# Patient Record
Sex: Male | Born: 1950
Health system: Southern US, Community
[De-identification: ages and names within clinical notes are randomized; demographics above are authoritative.]

## PROBLEM LIST (undated history)

## (undated) DIAGNOSIS — R0683 Snoring: Secondary | ICD-10-CM

## (undated) DIAGNOSIS — I10 Essential (primary) hypertension: Secondary | ICD-10-CM

## (undated) DIAGNOSIS — G5600 Carpal tunnel syndrome, unspecified upper limb: Secondary | ICD-10-CM

## (undated) DIAGNOSIS — M109 Gout, unspecified: Secondary | ICD-10-CM

## (undated) DIAGNOSIS — K219 Gastro-esophageal reflux disease without esophagitis: Secondary | ICD-10-CM

## (undated) DIAGNOSIS — M5030 Other cervical disc degeneration, unspecified cervical region: Secondary | ICD-10-CM

## (undated) DIAGNOSIS — F32A Depression, unspecified: Secondary | ICD-10-CM

## (undated) DIAGNOSIS — E785 Hyperlipidemia, unspecified: Secondary | ICD-10-CM

## (undated) DIAGNOSIS — F329 Major depressive disorder, single episode, unspecified: Secondary | ICD-10-CM

## (undated) DIAGNOSIS — Z973 Presence of spectacles and contact lenses: Secondary | ICD-10-CM

## (undated) DIAGNOSIS — M1990 Unspecified osteoarthritis, unspecified site: Secondary | ICD-10-CM

## (undated) HISTORY — DX: Depression, unspecified: F32.A

## (undated) HISTORY — PX: POLYPECTOMY: SHX149

## (undated) HISTORY — DX: Gout, unspecified: M10.9

## (undated) HISTORY — PX: SHOULDER SURGERY: SHX246

## (undated) HISTORY — DX: Major depressive disorder, single episode, unspecified: F32.9

## (undated) HISTORY — PX: COLONOSCOPY: SHX174

---

## 1981-02-20 HISTORY — PX: SHOULDER ARTHROSCOPY: SHX128

## 2005-04-18 ENCOUNTER — Ambulatory Visit: Payer: Self-pay | Admitting: Gastroenterology

## 2005-05-01 ENCOUNTER — Ambulatory Visit: Payer: Self-pay | Admitting: Gastroenterology

## 2006-02-20 HISTORY — PX: CARPAL TUNNEL RELEASE: SHX101

## 2010-05-04 ENCOUNTER — Encounter: Payer: Self-pay | Admitting: Gastroenterology

## 2010-05-10 NOTE — Letter (Signed)
Summary: Colonoscopy Letter  [facility]  [address] [place], [place] [address]   Phone: [phone]  Fax: [phone]      May 04, 2010 MRN: [id number]   Jeff Cunningham [address] [place], [place]  [address]   Dear Jeff Cunningham,   According to your medical record, it is time for you to schedule a Colonoscopy. The American Cancer Society recommends this procedure as a method to detect early colon cancer. Patients with a family history of colon cancer, or a personal history of colon polyps or inflammatory bowel disease are at increased risk.  This letter has been generated based on the recommendations made at the time of your procedure. If you feel that in your particular situation this may no longer apply, please contact our office.  Please call our office at [phone] to schedule this appointment or to update your records at your earliest convenience.  Thank you for cooperating with [place] to provide you with the very best care possible.   Sincerely,  Judie Petit T. Russella Dar, M.D.  [facility] [phone]

## 2011-05-10 ENCOUNTER — Emergency Department (HOSPITAL_COMMUNITY): Payer: No Typology Code available for payment source

## 2011-05-10 ENCOUNTER — Encounter (HOSPITAL_COMMUNITY): Payer: Self-pay | Admitting: Emergency Medicine

## 2011-05-10 ENCOUNTER — Emergency Department (HOSPITAL_COMMUNITY)
Admission: EM | Admit: 2011-05-10 | Discharge: 2011-05-10 | Disposition: A | Discharge status: Home / Self Care | Payer: No Typology Code available for payment source | Attending: Emergency Medicine | Admitting: Emergency Medicine

## 2011-05-10 DIAGNOSIS — E785 Hyperlipidemia, unspecified: Secondary | ICD-10-CM | POA: Insufficient documentation

## 2011-05-10 DIAGNOSIS — S239XXA Sprain of unspecified parts of thorax, initial encounter: Secondary | ICD-10-CM

## 2011-05-10 DIAGNOSIS — E119 Type 2 diabetes mellitus without complications: Secondary | ICD-10-CM | POA: Insufficient documentation

## 2011-05-10 DIAGNOSIS — I10 Essential (primary) hypertension: Secondary | ICD-10-CM | POA: Insufficient documentation

## 2011-05-10 DIAGNOSIS — M546 Pain in thoracic spine: Secondary | ICD-10-CM | POA: Insufficient documentation

## 2011-05-10 DIAGNOSIS — M542 Cervicalgia: Secondary | ICD-10-CM | POA: Insufficient documentation

## 2011-05-10 DIAGNOSIS — Y92410 Unspecified street and highway as the place of occurrence of the external cause: Secondary | ICD-10-CM | POA: Insufficient documentation

## 2011-05-10 HISTORY — DX: Essential (primary) hypertension: I10

## 2011-05-10 HISTORY — DX: Hyperlipidemia, unspecified: E78.5

## 2011-05-10 MED ORDER — IBUPROFEN 800 MG PO TABS: 800.0000 mg | ORAL_TABLET | Freq: Three times a day (TID) | ORAL | Status: AC

## 2011-05-10 MED ORDER — HYDROCODONE-ACETAMINOPHEN 5-325 MG PO TABS: 1.0000 | ORAL_TABLET | Freq: Four times a day (QID) | ORAL | Status: AC | PRN

## 2011-05-10 NOTE — Discharge Instructions (Signed)
Your x-rays here today were normal.  Return here for any worsening in her condition.  You will be more sore tomorrow and over the next 7-10 days.  Use ice and heat on her neck and back.
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
## 2011-05-10 NOTE — ED Notes (Signed)
Pt returned from xray

## 2011-05-10 NOTE — ED Notes (Signed)
Patient transported to X-ray 
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
## 2011-05-21 NOTE — ED Provider Notes (Signed)
Medical screening examination/treatment/procedure(s) were performed by non-physician practitioner and as supervising physician I was immediately available for consultation/collaboration.  Sacha Radloff, MD 05/21/11 2242 

## 2011-11-08 ENCOUNTER — Encounter: Payer: Self-pay | Admitting: Gastroenterology

## 2012-01-08 ENCOUNTER — Other Ambulatory Visit: Payer: Self-pay | Admitting: Orthopaedic Surgery

## 2012-01-12 ENCOUNTER — Encounter (HOSPITAL_BASED_OUTPATIENT_CLINIC_OR_DEPARTMENT_OTHER): Payer: Self-pay | Admitting: *Deleted

## 2012-01-12 ENCOUNTER — Other Ambulatory Visit: Payer: Self-pay

## 2012-01-12 ENCOUNTER — Encounter (HOSPITAL_BASED_OUTPATIENT_CLINIC_OR_DEPARTMENT_OTHER)
Admission: RE | Admit: 2012-01-12 | Discharge: 2012-01-12 | Disposition: A | Discharge status: Home / Self Care | Payer: Medicare Other | Source: Ambulatory Visit | Attending: Orthopaedic Surgery | Admitting: Orthopaedic Surgery

## 2012-01-12 DIAGNOSIS — Z6835 Body mass index (BMI) 35.0-35.9, adult: Secondary | ICD-10-CM | POA: Diagnosis not present

## 2012-01-12 DIAGNOSIS — E785 Hyperlipidemia, unspecified: Secondary | ICD-10-CM | POA: Diagnosis not present

## 2012-01-12 DIAGNOSIS — I10 Essential (primary) hypertension: Secondary | ICD-10-CM | POA: Diagnosis not present

## 2012-01-12 DIAGNOSIS — K219 Gastro-esophageal reflux disease without esophagitis: Secondary | ICD-10-CM | POA: Diagnosis not present

## 2012-01-12 DIAGNOSIS — M129 Arthropathy, unspecified: Secondary | ICD-10-CM | POA: Diagnosis not present

## 2012-01-12 DIAGNOSIS — E119 Type 2 diabetes mellitus without complications: Secondary | ICD-10-CM | POA: Diagnosis not present

## 2012-01-12 DIAGNOSIS — G5600 Carpal tunnel syndrome, unspecified upper limb: Secondary | ICD-10-CM | POA: Diagnosis not present

## 2012-01-12 LAB — BASIC METABOLIC PANEL
BUN: 11 mg/dL (ref 6–23)
Chloride: 103 mEq/L (ref 96–112)
Glucose, Bld: 121 mg/dL — ABNORMAL HIGH (ref 70–99)
Potassium: 4.5 mEq/L (ref 3.5–5.1)

## 2012-01-12 NOTE — H&P (Signed)
Jeff Cunningham is an 61 y.o. male.   Chief Complaint: left hand numbness and tingling. HPI: this patient has had continued left hand numbness and tingling for quite some time.  He feels that he is dropping things and is having trouble manipulating his fingers for fine motors function.  A recent nerve conduction study done by Dr. Regino Schultze was positive for a couple tunnel syndrome left side.  Jeff Cunningham is Arty had the right side released in 2007.  We have discussed proceeding with a left carpal tunnel release to improve function and decrease numbness.  Past Medical History  Diagnosis Date  . Hypertension   . Diabetes mellitus   . Hyperlipidemia   . GERD (gastroesophageal reflux disease)   . Arthritis   . DDD (degenerative disc disease), cervical   . Wears glasses   . Snores   . Carpal tunnel syndrome     Past Surgical History  Procedure Date  . Shoulder surgery   . Carpal tunnel release 2008    rt  . Shoulder arthroscopy 1983    left  . Colonoscopy     No family history on file. Social History:  reports that he has never smoked. He does not have any smokeless tobacco history on file. He reports that he drinks alcohol. His drug history not on file.  Allergies: No Known Allergies  No prescriptions prior to admission    No results found for this or any previous visit (from the past 48 hour(s)). No results found.  Review of Systems  Constitutional: Negative.   HENT: Negative.   Eyes: Negative.   Respiratory: Negative.   Cardiovascular: Negative.   Gastrointestinal: Negative.   Genitourinary: Negative.   Musculoskeletal: Negative.   Skin: Negative.   Neurological: Positive for tingling.  Endo/Heme/Allergies: Negative.   Psychiatric/Behavioral: Negative.     Height 5\' 8"  (1.727 m), weight 106.595 kg (235 lb). Physical Exam  Constitutional: He appears well-nourished.  HENT:  Head: Atraumatic.  Eyes: EOM are normal.  Neck: Neck supple.  Cardiovascular: Regular rhythm.     Respiratory: Breath sounds normal.  GI: Bowel sounds are normal.  Musculoskeletal:       Left hand exam: No atrophy noted.  Positive Tinel's at the wrist.  He does put past 2 point discrimination in both distributions a 10 mm.  Good grip strength.  Good pulses.  Neurological: He is alert.  Skin: Skin is warm.  Psychiatric: He has a normal mood and affect.     Assessment/Plan Assessment: Left carpal tunnel syndrome by recent nerve conduction study. Plan.  He does have significant carpal tunnel issues on the left side by the recent nerve conduction study.  We have discussed with him proceeding with a left carpal tunnel release and have reviewed the risk of anesthesia, infection and neurovascular injury related to carpal tunnel release on the left.  Have also told him it most likely will need postoperative physical therapy.  Jeff Cunningham,Jeff Cunningham 01/12/2012, 12:47 PM

## 2012-01-12 NOTE — Progress Notes (Signed)
Here for ekg-bmet-no cardiac problems

## 2012-01-16 ENCOUNTER — Encounter (HOSPITAL_BASED_OUTPATIENT_CLINIC_OR_DEPARTMENT_OTHER): Payer: Self-pay | Admitting: Anesthesiology

## 2012-01-16 ENCOUNTER — Ambulatory Visit (HOSPITAL_BASED_OUTPATIENT_CLINIC_OR_DEPARTMENT_OTHER): Payer: Medicare Other | Admitting: Anesthesiology

## 2012-01-16 ENCOUNTER — Encounter (HOSPITAL_BASED_OUTPATIENT_CLINIC_OR_DEPARTMENT_OTHER): Payer: Self-pay | Admitting: *Deleted

## 2012-01-16 ENCOUNTER — Ambulatory Visit (HOSPITAL_BASED_OUTPATIENT_CLINIC_OR_DEPARTMENT_OTHER)
Admission: RE | Admit: 2012-01-16 | Discharge: 2012-01-16 | Disposition: A | Discharge status: Home / Self Care | Payer: Medicare Other | Source: Ambulatory Visit | Attending: Orthopaedic Surgery | Admitting: Orthopaedic Surgery

## 2012-01-16 ENCOUNTER — Encounter (HOSPITAL_BASED_OUTPATIENT_CLINIC_OR_DEPARTMENT_OTHER)
Admission: RE | Disposition: A | Discharge status: Home / Self Care | Payer: Self-pay | Source: Ambulatory Visit | Attending: Orthopaedic Surgery

## 2012-01-16 DIAGNOSIS — G5600 Carpal tunnel syndrome, unspecified upper limb: Secondary | ICD-10-CM | POA: Insufficient documentation

## 2012-01-16 DIAGNOSIS — G5602 Carpal tunnel syndrome, left upper limb: Secondary | ICD-10-CM

## 2012-01-16 DIAGNOSIS — E119 Type 2 diabetes mellitus without complications: Secondary | ICD-10-CM | POA: Insufficient documentation

## 2012-01-16 DIAGNOSIS — I10 Essential (primary) hypertension: Secondary | ICD-10-CM | POA: Insufficient documentation

## 2012-01-16 DIAGNOSIS — Z6835 Body mass index (BMI) 35.0-35.9, adult: Secondary | ICD-10-CM | POA: Insufficient documentation

## 2012-01-16 DIAGNOSIS — K219 Gastro-esophageal reflux disease without esophagitis: Secondary | ICD-10-CM | POA: Insufficient documentation

## 2012-01-16 DIAGNOSIS — M129 Arthropathy, unspecified: Secondary | ICD-10-CM | POA: Insufficient documentation

## 2012-01-16 DIAGNOSIS — E785 Hyperlipidemia, unspecified: Secondary | ICD-10-CM | POA: Insufficient documentation

## 2012-01-16 HISTORY — DX: Other cervical disc degeneration, unspecified cervical region: M50.30

## 2012-01-16 HISTORY — DX: Carpal tunnel syndrome, unspecified upper limb: G56.00

## 2012-01-16 HISTORY — DX: Unspecified osteoarthritis, unspecified site: M19.90

## 2012-01-16 HISTORY — DX: Snoring: R06.83

## 2012-01-16 HISTORY — PX: CARPAL TUNNEL RELEASE: SHX101

## 2012-01-16 HISTORY — DX: Presence of spectacles and contact lenses: Z97.3

## 2012-01-16 HISTORY — DX: Gastro-esophageal reflux disease without esophagitis: K21.9

## 2012-01-16 LAB — POCT HEMOGLOBIN-HEMACUE: Hemoglobin: 14.7 g/dL (ref 13.0–17.0)

## 2012-01-16 LAB — GLUCOSE, CAPILLARY: Glucose-Capillary: 130 mg/dL — ABNORMAL HIGH (ref 70–99)

## 2012-01-16 SURGERY — CARPAL TUNNEL RELEASE
Anesthesia: General | Site: Wrist | Laterality: Left | Wound class: Clean

## 2012-01-16 MED ORDER — LACTATED RINGERS IV SOLN
INTRAVENOUS | Status: DC
  Administered 2012-01-16 (×2): via INTRAVENOUS

## 2012-01-16 MED ORDER — CHLORHEXIDINE GLUCONATE 4 % EX LIQD: 60.0000 mL | Freq: Once | CUTANEOUS | Status: DC

## 2012-01-16 MED ORDER — LACTATED RINGERS IV SOLN: INTRAVENOUS | Status: DC

## 2012-01-16 MED ORDER — MIDAZOLAM HCL 5 MG/5ML IJ SOLN
INTRAMUSCULAR | Status: DC | PRN
  Administered 2012-01-16: 2 mg via INTRAVENOUS

## 2012-01-16 MED ORDER — OXYCODONE HCL 5 MG/5ML PO SOLN: 5.0000 mg | Freq: Once | ORAL | Status: DC | PRN

## 2012-01-16 MED ORDER — CEFAZOLIN SODIUM-DEXTROSE 2-3 GM-% IV SOLR
2.0000 g | INTRAVENOUS | Status: AC
  Administered 2012-01-16: 2 g via INTRAVENOUS

## 2012-01-16 MED ORDER — ONDANSETRON HCL 4 MG/2ML IJ SOLN: 4.0000 mg | Freq: Once | INTRAMUSCULAR | Status: DC | PRN

## 2012-01-16 MED ORDER — FENTANYL CITRATE 0.05 MG/ML IJ SOLN
INTRAMUSCULAR | Status: DC | PRN
  Administered 2012-01-16: 25 ug via INTRAVENOUS
  Administered 2012-01-16: 100 ug via INTRAVENOUS

## 2012-01-16 MED ORDER — OXYCODONE-ACETAMINOPHEN 10-325 MG PO TABS: 1.0000 | ORAL_TABLET | ORAL | Status: DC | PRN

## 2012-01-16 MED ORDER — OXYCODONE HCL 5 MG PO TABS: 5.0000 mg | ORAL_TABLET | Freq: Once | ORAL | Status: DC | PRN

## 2012-01-16 MED ORDER — ONDANSETRON HCL 4 MG/2ML IJ SOLN
INTRAMUSCULAR | Status: DC | PRN
  Administered 2012-01-16: 4 mg via INTRAVENOUS

## 2012-01-16 MED ORDER — HYDROMORPHONE HCL PF 1 MG/ML IJ SOLN: 0.2500 mg | INTRAMUSCULAR | Status: DC | PRN

## 2012-01-16 MED ORDER — LIDOCAINE HCL (CARDIAC) 20 MG/ML IV SOLN
INTRAVENOUS | Status: DC | PRN
  Administered 2012-01-16: 75 mg via INTRAVENOUS

## 2012-01-16 MED ORDER — PROPOFOL 10 MG/ML IV BOLUS
INTRAVENOUS | Status: DC | PRN
  Administered 2012-01-16: 300 mg via INTRAVENOUS

## 2012-01-16 MED ORDER — BUPIVACAINE HCL (PF) 0.25 % IJ SOLN
INTRAMUSCULAR | Status: DC | PRN
  Administered 2012-01-16: 8 mL

## 2012-01-16 SURGICAL SUPPLY — 51 items
BANDAGE ELASTIC 4 VELCRO ST LF (GAUZE/BANDAGES/DRESSINGS) ×2 IMPLANT
BANDAGE GAUZE ELAST BULKY 4 IN (GAUZE/BANDAGES/DRESSINGS) ×2 IMPLANT
BLADE MINI RND TIP GREEN BEAV (BLADE) ×2 IMPLANT
BLADE SURG 15 STRL LF DISP TIS (BLADE) ×1 IMPLANT
BLADE SURG 15 STRL SS (BLADE) ×2
BNDG CMPR 9X4 STRL LF SNTH (GAUZE/BANDAGES/DRESSINGS) ×1
BNDG ESMARK 4X9 LF (GAUZE/BANDAGES/DRESSINGS) ×1 IMPLANT
COVER MAYO STAND STRL (DRAPES) ×2 IMPLANT
COVER TABLE BACK 60X90 (DRAPES) ×2 IMPLANT
CUFF TOURNIQUET SINGLE 18IN (TOURNIQUET CUFF) ×1 IMPLANT
DRAPE EXTREMITY T 121X128X90 (DRAPE) ×2 IMPLANT
DRAPE SURG 17X23 STRL (DRAPES) ×2 IMPLANT
DRSG EMULSION OIL 3X3 NADH (GAUZE/BANDAGES/DRESSINGS) ×2 IMPLANT
DRSG PAD ABDOMINAL 8X10 ST (GAUZE/BANDAGES/DRESSINGS) IMPLANT
DURAPREP 26ML APPLICATOR (WOUND CARE) ×2 IMPLANT
ELECT NDL TIP 2.8 STRL (NEEDLE) IMPLANT
ELECT NEEDLE TIP 2.8 STRL (NEEDLE) IMPLANT
ELECT REM PT RETURN 9FT ADLT (ELECTROSURGICAL)
ELECTRODE REM PT RTRN 9FT ADLT (ELECTROSURGICAL) ×1 IMPLANT
GLOVE BIO SURGEON STRL SZ8.5 (GLOVE) ×2 IMPLANT
GLOVE BIOGEL PI IND STRL 8 (GLOVE) ×1 IMPLANT
GLOVE BIOGEL PI IND STRL 8.5 (GLOVE) ×1 IMPLANT
GLOVE BIOGEL PI INDICATOR 8 (GLOVE) ×1
GLOVE BIOGEL PI INDICATOR 8.5 (GLOVE) ×1
GLOVE ECLIPSE 6.5 STRL STRAW (GLOVE) ×1 IMPLANT
GLOVE SS BIOGEL STRL SZ 8 (GLOVE) ×1 IMPLANT
GLOVE SUPERSENSE BIOGEL SZ 8 (GLOVE) ×1
GOWN PREVENTION PLUS XLARGE (GOWN DISPOSABLE) ×2 IMPLANT
LOOP VESSEL MAXI BLUE (MISCELLANEOUS) IMPLANT
NDL HYPO 25X1 1.5 SAFETY (NEEDLE) IMPLANT
NEEDLE HYPO 25X1 1.5 SAFETY (NEEDLE) ×2 IMPLANT
NS IRRIG 1000ML POUR BTL (IV SOLUTION) ×2 IMPLANT
PACK BASIN DAY SURGERY FS (CUSTOM PROCEDURE TRAY) ×2 IMPLANT
PADDING CAST ABS 3INX4YD NS (CAST SUPPLIES) ×1
PADDING CAST ABS 4INX4YD NS (CAST SUPPLIES) ×1
PADDING CAST ABS COTTON 3X4 (CAST SUPPLIES) ×1 IMPLANT
PADDING CAST ABS COTTON 4X4 ST (CAST SUPPLIES) IMPLANT
PENCIL BUTTON HOLSTER BLD 10FT (ELECTRODE) IMPLANT
SPLINT PLASTER CAST XFAST 3X15 (CAST SUPPLIES) ×5 IMPLANT
SPLINT PLASTER XTRA FASTSET 3X (CAST SUPPLIES) ×5
SPONGE GAUZE 4X4 12PLY (GAUZE/BANDAGES/DRESSINGS) ×2 IMPLANT
STOCKINETTE 4X48 STRL (DRAPES) ×2 IMPLANT
SUT ETHILON 4 0 PS 2 18 (SUTURE) ×2 IMPLANT
SUT VIC AB 4-0 P-3 18XBRD (SUTURE) IMPLANT
SUT VIC AB 4-0 P3 18 (SUTURE)
SYR BULB 3OZ (MISCELLANEOUS) ×1 IMPLANT
SYR CONTROL 10ML LL (SYRINGE) ×1 IMPLANT
TOWEL OR 17X24 6PK STRL BLUE (TOWEL DISPOSABLE) ×2 IMPLANT
TOWEL OR NON WOVEN STRL DISP B (DISPOSABLE) ×2 IMPLANT
UNDERPAD 30X30 INCONTINENT (UNDERPADS AND DIAPERS) ×2 IMPLANT
WATER STERILE IRR 1000ML POUR (IV SOLUTION) ×1 IMPLANT

## 2012-01-16 NOTE — Transfer of Care (Signed)
Immediate Anesthesia Transfer of Care Note  Patient: Jeff Cunningham  Procedure(s) Performed: Procedure(s) (LRB): CARPAL TUNNEL RELEASE (Left)  Patient Location: Patient transported to PACU with oxygen via face mask at 4 Liters / Min  Anesthesia Type: General  Level of Consciousness: awake and alert   Airway & Oxygen Therapy: Patient Spontanous Breathing and Patient connected to face mask oxygen  Post-op Assessment: Report given to PACU RN and Post -op Vital signs reviewed and stable  Post vital signs: Reviewed and stable  Dentition: Teeth and oropharynx remain in pre-op condition  Complications: No apparent anesthesia complications

## 2012-01-16 NOTE — Anesthesia Postprocedure Evaluation (Signed)
  Anesthesia Post-op Note  Patient: Jeff Cunningham  Procedure(s) Performed: Procedure(s) (LRB) with comments: CARPAL TUNNEL RELEASE (Left)  Patient Location: PACU  Anesthesia Type:General  Level of Consciousness: awake, alert  and oriented  Airway and Oxygen Therapy: Patient Spontanous Breathing  Post-op Pain: mild  Post-op Assessment: Post-op Vital signs reviewed  Post-op Vital Signs: Reviewed  Complications: No apparent anesthesia complications

## 2012-01-16 NOTE — Interval H&P Note (Signed)
History and Physical Interval Note:  01/16/2012 9:28 AM  Jeff Cunningham  has presented today for surgery, with the diagnosis of Left Carpal Tunnel Syndrome  The various methods of treatment have been discussed with the patient and family. After consideration of risks, benefits and other options for treatment, the patient has consented to  Procedure(s) (LRB) with comments: CARPAL TUNNEL RELEASE (Left) as a surgical intervention .  The patient's history has been reviewed, patient examined, no change in status, stable for surgery.  I have reviewed the patient's chart and labs.  Questions were answered to the patient's satisfaction.     [name] G

## 2012-01-16 NOTE — Anesthesia Procedure Notes (Signed)
Procedure Name: LMA Insertion Date/Time: 01/16/2012 9:54 AM Performed by: Fran Lowes Pre-anesthesia Checklist: Patient identified, Emergency Drugs available, Suction available and Patient being monitored Patient Re-evaluated:Patient Re-evaluated prior to inductionOxygen Delivery Method: Circle System Utilized Preoxygenation: Pre-oxygenation with 100% oxygen Intubation Type: IV induction Ventilation: Mask ventilation without difficulty LMA: LMA inserted LMA Size: 4.0 Number of attempts: 1 Airway Equipment and Method: bite block Placement Confirmation: positive ETCO2 Tube secured with: Tape Dental Injury: Teeth and Oropharynx as per pre-operative assessment

## 2012-01-16 NOTE — Op Note (Signed)
PRE-OP DIAGNOSIS:  left carpal tunnel syndrome POST-OP DIAGNOSIS:  left carpal tunnel syndrome PROCEDURE:  left carpal tunnel release ANESTHESIA:  general ATTENDING SURGEON:  Marcene Corning MD ASSISTANT:  Lindwood Qua PA  INDICATION FOR PROCEDURE:  Jeff Cunningham is a 61 y.o. male with a long history of hand pain and numbness.  The patient has failed conservative measures of treatment and persists with symptoms which are very limiting.  The patient is offered carpal tunnel release in hopes of improving their situation by relieving pressure on the medial nerve.  Informed operative consent was obtained after discussion of possible complications including reaction to anesthesia, infection, and neurovascular injury.  SUMMARY OF FINDINGS AND PROCEDURE:  Under to above anesthetic a carpal tunnel release was performed.  The patient had a moderately thickened transverse carpal tunnel ligament which was applying compression to the underlying medial nerve.  This was release under direct visualization.  The skin was closed primarily followed by placement of a sterile dressing and splint with the wrist in slight extension.  DESCRIPTION OF PROCEDURE:  Jeff Cunningham was taken to an operative suite where the above anesthetic was applied.  The patient was then prepped and draped in normal sterile fashion.  An appropriate time out was performed.  After the administration of kefzol pre-operative antibiotic and application and inflation of a tourniquet a small incision was made on the palmar aspect of the palm.  This did not cross the wrist flexion crease and was ulnar to the thenar flexion crease.  Dissection was carried down through palmar fascia to expose the transverse carpal ligament which was released under direct visualization.  The dissection was taken distally towards the transverse arch of vessels and distally through the distal fascia of the forearm.  The wound was irrigated and the skin was closed with  nylon.  The tourniquet was deflated and skin edges became pink immediately and pulses were intact.  I injected some marcaine along the incision. Adaptic was applied along with a sterile dressing and a plaster splint with the wrist in slight extension.  Estimated blood loss and intraoperative fluids can be obtained from anesthesia records as can tourniquet time.  DISPOSITION:  Jeff Cunningham was taken to recovery room in stable condition.  Plans were to go home same day and I will contact the patient by phone tonight.  The patient will follow-up in about a week in the office.  Jeff Cunningham 01/16/2012, 10:22 AM

## 2012-01-16 NOTE — Anesthesia Preprocedure Evaluation (Signed)
Anesthesia Evaluation  Patient identified by MRN, date of birth, ID band Patient awake    Reviewed: Allergy & Precautions, H&P , NPO status , Patient's Chart, lab work & pertinent test results  Airway Mallampati: I TM Distance: >3 FB Neck ROM: Full    Dental  (+) Teeth Intact and Dental Advisory Given   Pulmonary  breath sounds clear to auscultation        Cardiovascular hypertension, Pt. on medications Rhythm:Regular Rate:Normal     Neuro/Psych    GI/Hepatic GERD-  Medicated and Controlled,  Endo/Other  diabetes, Well Controlled, Type 2, Oral Hypoglycemic AgentsMorbid obesity  Renal/GU      Musculoskeletal   Abdominal   Peds  Hematology   Anesthesia Other Findings   Reproductive/Obstetrics                           Anesthesia Physical Anesthesia Plan  ASA: III  Anesthesia Plan: General   Post-op Pain Management:    Induction: Intravenous  Airway Management Planned: LMA  Additional Equipment:   Intra-op Plan:   Post-operative Plan: Extubation in OR  Informed Consent: I have reviewed the patients History and Physical, chart, labs and discussed the procedure including the risks, benefits and alternatives for the proposed anesthesia with the patient or authorized representative who has indicated his/her understanding and acceptance.   Dental advisory given  Plan Discussed with: CRNA, Anesthesiologist and Surgeon  Anesthesia Plan Comments:         Anesthesia Quick Evaluation

## 2012-01-17 LAB — GLUCOSE, CAPILLARY: Glucose-Capillary: 129 mg/dL — ABNORMAL HIGH (ref 70–99)

## 2012-01-22 ENCOUNTER — Encounter (HOSPITAL_BASED_OUTPATIENT_CLINIC_OR_DEPARTMENT_OTHER): Payer: Self-pay | Admitting: Orthopaedic Surgery

## 2012-03-10 ENCOUNTER — Other Ambulatory Visit: Payer: Self-pay | Admitting: Family Medicine

## 2012-03-15 ENCOUNTER — Other Ambulatory Visit: Payer: Self-pay | Admitting: Family Medicine

## 2012-03-25 ENCOUNTER — Other Ambulatory Visit: Payer: Self-pay | Admitting: Family Medicine

## 2012-04-01 DIAGNOSIS — Z125 Encounter for screening for malignant neoplasm of prostate: Secondary | ICD-10-CM | POA: Diagnosis not present

## 2012-08-20 ENCOUNTER — Encounter: Payer: Self-pay | Admitting: Gastroenterology

## 2012-10-15 ENCOUNTER — Ambulatory Visit (AMBULATORY_SURGERY_CENTER): Payer: Medicare Other

## 2012-10-15 VITALS — Ht 67.0 in | Wt 231.4 lb

## 2012-10-15 DIAGNOSIS — Z860100 Personal history of colon polyps, unspecified: Secondary | ICD-10-CM

## 2012-10-15 MED ORDER — SOD PICOSULFATE-MAG OX-CIT ACD 10-3.5-12 MG-GM-GM PO PACK: 1.0000 | PACK | Freq: Once | ORAL | Status: DC

## 2012-10-15 MED ORDER — MOVIPREP 100 G PO SOLR: 1.0000 | Freq: Once | ORAL | Status: DC

## 2012-10-16 ENCOUNTER — Telehealth: Payer: Self-pay | Admitting: Gastroenterology

## 2012-10-16 NOTE — Telephone Encounter (Signed)
Pt will come by office ([facility] 4th floor) Thursday 10/17/12 to pick up sample of Prepopik.

## 2012-10-29 ENCOUNTER — Encounter: Payer: Self-pay | Admitting: Gastroenterology

## 2012-10-29 ENCOUNTER — Ambulatory Visit (AMBULATORY_SURGERY_CENTER): Payer: Medicare Other | Admitting: Gastroenterology

## 2012-10-29 VITALS — BP 114/72 | HR 52 | Temp 97.2°F | Resp 19 | Ht 67.0 in | Wt 231.0 lb

## 2012-10-29 DIAGNOSIS — D126 Benign neoplasm of colon, unspecified: Secondary | ICD-10-CM

## 2012-10-29 DIAGNOSIS — Z860100 Personal history of colon polyps, unspecified: Secondary | ICD-10-CM

## 2012-10-29 LAB — GLUCOSE, CAPILLARY: Glucose-Capillary: 99 mg/dL (ref 70–99)

## 2012-10-29 MED ORDER — SODIUM CHLORIDE 0.9 % IV SOLN: 500.0000 mL | INTRAVENOUS | Status: DC

## 2012-10-29 NOTE — Progress Notes (Signed)
Patient did not experience any of the following events: a burn prior to discharge; a fall within the facility; wrong site/side/patient/procedure/implant event; or a hospital transfer or hospital admission upon discharge from the facility. (G8907) Patient did not have preoperative order for IV antibiotic SSI prophylaxis. (G8918)  

## 2012-10-29 NOTE — Progress Notes (Signed)
Report to pacu rn, vss, bbs=clear 

## 2012-10-29 NOTE — Patient Instructions (Addendum)
YOU HAD AN ENDOSCOPIC PROCEDURE TODAY AT THE [facility]: Refer to the procedure report that was given to you for any specific questions about what was found during the examination.  If the procedure report does not answer your questions, please call your gastroenterologist to clarify.  If you requested that your care partner not be given the details of your procedure findings, then the procedure report has been included in a sealed envelope for you to review at your convenience later.  YOU SHOULD EXPECT: Some feelings of bloating in the abdomen. Passage of more gas than usual.  Walking can help get rid of the air that was put into your GI tract during the procedure and reduce the bloating. If you had a lower endoscopy (such as a colonoscopy or flexible sigmoidoscopy) you may notice spotting of blood in your stool or on the toilet paper. If you underwent a bowel prep for your procedure, then you may not have a normal bowel movement for a few days.  DIET: Your first meal following the procedure should be a light meal and then it is ok to progress to your normal diet.  A half-sandwich or bowl of soup is an example of a good first meal.  Heavy or fried foods are harder to digest and may make you feel nauseous or bloated.  Likewise meals heavy in dairy and vegetables can cause extra gas to form and this can also increase the bloating.  Drink plenty of fluids but you should avoid alcoholic beverages for 24 hours.  ACTIVITY: Your care partner should take you home directly after the procedure.  You should plan to take it easy, moving slowly for the rest of the day.  You can resume normal activity the day after the procedure however you should NOT DRIVE or use heavy machinery for 24 hours (because of the sedation medicines used during the test).    SYMPTOMS TO REPORT IMMEDIATELY: A gastroenterologist can be reached at any hour.  During normal business hours, 8:30 AM to 5:00 PM Monday through Friday,  call [phone].  After hours and on weekends, please call the GI answering service at [phone] who will take a message and have the physician on call contact you.   Following lower endoscopy (colonoscopy or flexible sigmoidoscopy):  Excessive amounts of blood in the stool  Significant tenderness or worsening of abdominal pains  Swelling of the abdomen that is new, acute  Fever of 100F or higher   FOLLOW UP: If any biopsies were taken you will be contacted by phone or by letter within the next 1-3 weeks.  Call your gastroenterologist if you have not heard about the biopsies in 3 weeks.  Our staff will call the home number listed on your records the next business day following your procedure to check on you and address any questions or concerns that you may have at that time regarding the information given to you following your procedure. This is a courtesy call and so if there is no answer at the home number and we have not heard from you through the emergency physician on call, we will assume that you have returned to your regular daily activities without incident.  SIGNATURES/CONFIDENTIALITY: You and/or your care partner have signed paperwork which will be entered into your electronic medical record.  These signatures attest to the fact that that the information above on your After Visit Summary has been reviewed and is understood.  Full responsibility of the confidentiality of   this discharge information lies with you and/or your care-partner.   Resume medications. Information given on polyps,diverticulosis and high fiber diet with discharge instructions. 

## 2012-10-29 NOTE — Progress Notes (Signed)
Called to room to assist during endoscopic procedure.  Patient ID and intended procedure confirmed with present staff. Received instructions for my participation in the procedure from the performing physician. ewm 

## 2012-10-29 NOTE — Op Note (Addendum)
[facility] [address] [place], [address]   COLONOSCOPY PROCEDURE REPORT PATIENT: Jeff Cunningham, Jeff Cunningham  MR#: [id number] BIRTHDATE: 01/26/1951 , 62  yrs. old GENDER: Male ENDOSCOPIST: Meryl Dare, MD, [facility] PROCEDURE DATE:  10/29/2012 PROCEDURE:   Colonoscopy with biopsy and snare polypectomy First Screening Colonoscopy - Avg.  risk and is 50 yrs.  old or older - No.  Prior Negative Screening - Now for repeat screening. N/A  History of Adenoma - Now for follow-up colonoscopy & has been > or = to 3 yrs.  Yes hx of adenoma.  Has been 3 or more years since last colonoscopy.  Polyps Removed Today? Yes. ASA CLASS:   Class II INDICATIONS:Patient's personal history of adenomatous colon polyps.  MEDICATIONS: MAC sedation, administered by CRNA and propofol (Diprivan) 280mg  IV DESCRIPTION OF PROCEDURE:   After the risks benefits and alternatives of the procedure were thoroughly explained, informed consent was obtained.  A digital rectal exam revealed no abnormalities of the rectum.   The LB [id number]  endoscope was introduced through the anus and advanced to the cecum, which was identified by both the appendix and ileocecal valve. No adverse events experienced.   The quality of the prep was [place] good The instrument was then slowly withdrawn as the colon was fully examined.  COLON FINDINGS: A sessile polyp measuring 7 mm in size was found at the cecum.  A polypectomy was performed with a cold snare.  The resection was complete and the polyp tissue was completely retrieved.  Three sessile polyps measuring 5-6 mm in size were found in the ascending colon. A polypectomy was performed with a cold snare. The resection was complete and the polyp tissue was completely retrieved. Two sessile polyps measuring 3 mm in size were found in the ascending colon. A polypectomy was performed with cold forceps.  The resection was complete and the polyp tissue was completely  retrieved. A sessile polyp measuring 6 mm in size was found in the transverse colon.  A polypectomy was performed with a cold snare. The resection was complete and the polyp tissue was completely retrieved.  Mild diverticulosis was noted in the ascending colon and transverse colon.  The colon was otherwise normal.  There was no diverticulosis, inflammation, polyps or cancers unless previously stated. Retroflexed views revealed no abnormalities. The time to cecum=2 minutes 45 seconds. Withdrawal time=11 minutes 59 seconds. The scope was withdrawn and the procedure completed. COMPLICATIONS: There were no complications. ENDOSCOPIC IMPRESSION: 1.   Sessile polyp measuring 7 mm at the cecum; polypectomy performed with a cold snare 2.   Three sessile polyps measuring 5-6 mm in the ascending colon; polypectomy performed with a cold snare 3.   Two sessile polyps measuring 3 mm in the ascending colon; polypectomy performed with cold forceps 4.   Sessile polyp measuring 6 mm in the transverse colon; polypectomy performed with a cold snare 5.   Mild diverticulosis was noted in the ascending colon and transverse colon RECOMMENDATIONS: 1.  Await pathology results 2.  Repeat colonoscopy in 3 years if 3 or more polyps adenomatous; otherwise 5 years eSigned:  Meryl Dare, MD, [facility] 10/29/2012 10:43 AM Revised: 10/29/2012 10:43 AM cc: Sigmund Hazel, MD   PATIENT NAME:  Jeff Cunningham, Jeff Cunningham MR#: [id number]

## 2012-10-30 ENCOUNTER — Telehealth: Payer: Self-pay

## 2012-10-30 NOTE — Telephone Encounter (Signed)
  Follow up Call-  Call back number 10/29/2012  Post procedure Call Back phone  # [phone]  Permission to leave phone message Yes     Patient questions:  Do you have a fever, pain , or abdominal swelling? no Pain Score  0 *  Have you tolerated food without any problems? yes  Have you been able to return to your normal activities? yes  Do you have any questions about your discharge instructions: Diet   no Medications  no Follow up visit  no  Do you have questions or concerns about your Care? no  Actions: * If pain score is 4 or above: No action needed, pain <4.  Per the pt's girl friend he did fine.  No problems. Maw

## 2012-11-05 ENCOUNTER — Encounter: Payer: Self-pay | Admitting: Gastroenterology

## 2012-12-11 ENCOUNTER — Other Ambulatory Visit: Payer: Self-pay | Admitting: Orthopedic Surgery

## 2012-12-24 ENCOUNTER — Encounter (HOSPITAL_BASED_OUTPATIENT_CLINIC_OR_DEPARTMENT_OTHER): Payer: Self-pay | Admitting: *Deleted

## 2012-12-24 NOTE — Progress Notes (Signed)
Pt was here 11/13 for ctr-did well-will come by for ekg-bmet

## 2012-12-24 NOTE — Progress Notes (Signed)
12/24/12 1543  OBSTRUCTIVE SLEEP APNEA  Have you ever been diagnosed with sleep apnea through a sleep study? No  Do you snore loudly (loud enough to be heard through closed doors)?  1  Do you often feel tired, fatigued, or sleepy during the daytime? 0  Has anyone observed you stop breathing during your sleep? 0  Do you have, or are you being treated for high blood pressure? 1  BMI more than 35 kg/m2? 0  Age over 64 years old? 1  Neck circumference greater than 40 cm/18 inches? 0  Gender: 1  Obstructive Sleep Apnea Score 4

## 2012-12-26 ENCOUNTER — Encounter (HOSPITAL_BASED_OUTPATIENT_CLINIC_OR_DEPARTMENT_OTHER)
Admission: RE | Admit: 2012-12-26 | Discharge: 2012-12-26 | Disposition: A | Discharge status: Home / Self Care | Payer: Medicare Other | Source: Ambulatory Visit | Attending: Orthopedic Surgery | Admitting: Orthopedic Surgery

## 2012-12-26 DIAGNOSIS — Z01818 Encounter for other preprocedural examination: Secondary | ICD-10-CM | POA: Diagnosis not present

## 2012-12-26 DIAGNOSIS — Z01812 Encounter for preprocedural laboratory examination: Secondary | ICD-10-CM | POA: Diagnosis not present

## 2012-12-26 LAB — BASIC METABOLIC PANEL
BUN: 10 mg/dL (ref 6–23)
CO2: 25 mEq/L (ref 19–32)
Chloride: 104 mEq/L (ref 96–112)
Creatinine, Ser: 0.77 mg/dL (ref 0.50–1.35)
GFR calc non Af Amer: >90 mL/min (ref >90)
Glucose, Bld: 129 mg/dL — ABNORMAL HIGH (ref 70–99)

## 2012-12-26 NOTE — H&P (Signed)
Jeff Cunningham is an 62 y.o. male.   CC / Reason for Visit: Bilateral hand symptoms HPI: This patient is Cunningham 62 year old male who presents for evaluation of his bilateral upper extremities.  He has previously undergone bilateral carpal tunnel release by Dr. Jerl Cunningham, the right in 2007 and the left on 01-16-12.  He reports that his numbness and tingling is presently in the ring and small fingers, greater than one year duration, affecting the ulnar border of the hand both volarly and dorsally.  He also has some achiness and tightness in the small joints that may be related to his underlying gout and reports that Dr. Hyacinth Cunningham is investigating that with additional blood work.  He reports that he has had to truncate electrodiagnostic evaluation both in 2007 and 2013 because even the nerve conduction study portion of the exam was intolerable.  Past Medical History  Diagnosis Date  . Hypertension   . Diabetes mellitus   . Hyperlipidemia   . GERD (gastroesophageal reflux disease)   . Arthritis   . DDD (degenerative disc disease), cervical   . Wears glasses   . Snores   . Carpal tunnel syndrome     Past Surgical History  Procedure Laterality Date  . Shoulder surgery    . Carpal tunnel release  2008    rt  . Shoulder arthroscopy  1983    left  . Colonoscopy    . Carpal tunnel release  01/16/2012    Procedure: CARPAL TUNNEL RELEASE;  Surgeon: Jeff Ochs, MD;  Location: [facility];  Service: Orthopedics;  Laterality: Left;    Family History  Problem Relation Age of Onset  . Colon cancer Neg Hx    Social History:  reports that he has never smoked. He has never used smokeless tobacco. He reports that he drinks alcohol. He reports that he does not use illicit drugs.  Allergies:  Allergies  Allergen Reactions  . Iodine Rash    No prescriptions prior to admission    Results for orders placed during the hospital encounter of 12/30/12 (from the past 48 hour(s))  BASIC  METABOLIC PANEL     Status: Abnormal   Collection Time    12/26/12  9:00 AM      Result Value Range   Sodium 139  135 - 145 mEq/L   Potassium 4.0  3.5 - 5.1 mEq/L   Chloride 104  96 - 112 mEq/L   CO2 25  19 - 32 mEq/L   Glucose, Bld 129 (*) 70 - 99 mg/dL   BUN 10  6 - 23 mg/dL   Creatinine, Ser 1.61  0.50 - 1.35 mg/dL   Calcium 9.3  8.4 - 09.6 mg/dL   GFR calc non Af Amer >90  >90 mL/min   GFR calc Af Amer >90  >90 mL/min   Comment: (NOTE)     The eGFR has been calculated using the CKD EPI equation.     This calculation has not been validated in all clinical situations.     eGFR's persistently <90 mL/min signify possible Chronic Kidney     Disease.   No results found.  Review of Systems  All other systems reviewed and are negative.    Height 5\' 8"  (1.727 m), weight 102.059 kg (225 lb). Physical Exam  Constitutional:  WD, WN, NAD HEENT:  NCAT, EOMI Neuro/Psych:  Alert & oriented to person, place, and time; appropriate mood & affect Lymphatic: No generalized UE edema or lymphadenopathy Extremities /  MSK:  Both UE are normal with respect to appearance, ranges of motion, joint stability, muscle strength/tone, sensation, & perfusion except as otherwise noted:  Bilaterally, the hands are normal in appearance without visible or appreciable atrophy.  Well-healed carpal tunnel release incisions are present.  He can detect Cunningham 3.6 1 monofilament across all the digits right and left, but has altered sensibility to light touch in the small finger and ulnar half of the ring fingers bilaterally, as well as the volar and dorsal ulnar sides of the hand.  The numbness and tingling does not extend into the medial forearm however.  Muscle strength is 5/5 and the ulnar nerve is hypersensitive to manipulation in the retro-condylar groove of the elbow bilaterally.  With elbow flexion and extension it appears stable however  Labs / Xrays:  No radiographic studies obtained  today.  Assessment: Suspected bilateral cubital tunnel syndrome, right greater than left  Plan:  I discussed with the patient that in most instances we would investigate this with electrodiagnostic studies.  Given his previous experience with that however, I understand his preference to avoid them.  He understands that this provides only clinical information with which we can make Cunningham decision, but I think ulnar neuroplasty at the elbow is reasonable.  I would recommend performing Cunningham decompression in situ, transitioning only to an anterior subcutaneous transposition if the nerve proves to be unstable intraoperatively.  He will confer with Dr. Hyacinth Cunningham and contact Jeff Cunningham, my surgery coordinator, when he wishes to proceed on the right side.  We did discuss the details of the operation including incision location and possible need for transposition.  Jeff Cunningham. 12/26/2012, 11:56 AM

## 2012-12-30 ENCOUNTER — Ambulatory Visit (HOSPITAL_BASED_OUTPATIENT_CLINIC_OR_DEPARTMENT_OTHER): Payer: Medicare Other | Admitting: Anesthesiology

## 2012-12-30 ENCOUNTER — Ambulatory Visit (HOSPITAL_BASED_OUTPATIENT_CLINIC_OR_DEPARTMENT_OTHER)
Admission: RE | Admit: 2012-12-30 | Discharge: 2012-12-30 | Disposition: A | Discharge status: Home / Self Care | Payer: Medicare Other | Source: Ambulatory Visit | Attending: Orthopedic Surgery | Admitting: Orthopedic Surgery

## 2012-12-30 ENCOUNTER — Encounter (HOSPITAL_BASED_OUTPATIENT_CLINIC_OR_DEPARTMENT_OTHER): Payer: Self-pay | Admitting: Anesthesiology

## 2012-12-30 ENCOUNTER — Encounter (HOSPITAL_BASED_OUTPATIENT_CLINIC_OR_DEPARTMENT_OTHER)
Admission: RE | Disposition: A | Discharge status: Home / Self Care | Payer: Self-pay | Source: Ambulatory Visit | Attending: Orthopedic Surgery

## 2012-12-30 ENCOUNTER — Encounter (HOSPITAL_BASED_OUTPATIENT_CLINIC_OR_DEPARTMENT_OTHER): Payer: Medicare Other | Admitting: Anesthesiology

## 2012-12-30 DIAGNOSIS — I10 Essential (primary) hypertension: Secondary | ICD-10-CM | POA: Insufficient documentation

## 2012-12-30 DIAGNOSIS — K219 Gastro-esophageal reflux disease without esophagitis: Secondary | ICD-10-CM | POA: Diagnosis not present

## 2012-12-30 DIAGNOSIS — G5680 Other specified mononeuropathies of unspecified upper limb: Secondary | ICD-10-CM | POA: Diagnosis not present

## 2012-12-30 DIAGNOSIS — E785 Hyperlipidemia, unspecified: Secondary | ICD-10-CM | POA: Diagnosis not present

## 2012-12-30 DIAGNOSIS — E119 Type 2 diabetes mellitus without complications: Secondary | ICD-10-CM | POA: Diagnosis not present

## 2012-12-30 DIAGNOSIS — G5600 Carpal tunnel syndrome, unspecified upper limb: Secondary | ICD-10-CM | POA: Insufficient documentation

## 2012-12-30 HISTORY — PX: ULNAR NERVE TRANSPOSITION: SHX2595

## 2012-12-30 LAB — GLUCOSE, CAPILLARY
Glucose-Capillary: 125 mg/dL — ABNORMAL HIGH (ref 70–99)
Glucose-Capillary: 137 mg/dL — ABNORMAL HIGH (ref 70–99)

## 2012-12-30 SURGERY — ULNAR NERVE DECOMPRESSION/TRANSPOSITION
Anesthesia: General | Site: Elbow | Laterality: Right | Wound class: Clean

## 2012-12-30 MED ORDER — MIDAZOLAM HCL 5 MG/5ML IJ SOLN
INTRAMUSCULAR | Status: DC | PRN
  Administered 2012-12-30: 2 mg via INTRAVENOUS

## 2012-12-30 MED ORDER — CHLORHEXIDINE GLUCONATE 4 % EX LIQD: 60.0000 mL | Freq: Once | CUTANEOUS | Status: DC

## 2012-12-30 MED ORDER — HYDROCODONE-ACETAMINOPHEN 5-325 MG PO TABS: 1.0000 | ORAL_TABLET | ORAL | Status: DC | PRN

## 2012-12-30 MED ORDER — PROPOFOL 10 MG/ML IV EMUL
INTRAVENOUS | Status: AC
  Filled 2012-12-30: qty 50

## 2012-12-30 MED ORDER — HYDROMORPHONE HCL PF 1 MG/ML IJ SOLN: 0.2500 mg | INTRAMUSCULAR | Status: DC | PRN

## 2012-12-30 MED ORDER — LACTATED RINGERS IV SOLN
INTRAVENOUS | Status: DC
  Administered 2012-12-30: 07:00:00 via INTRAVENOUS

## 2012-12-30 MED ORDER — BUPIVACAINE-EPINEPHRINE PF 0.5-1:200000 % IJ SOLN
INTRAMUSCULAR | Status: DC | PRN
  Administered 2012-12-30: 10 mL

## 2012-12-30 MED ORDER — HYDROMORPHONE HCL PF 1 MG/ML IJ SOLN: 0.5000 mg | INTRAMUSCULAR | Status: DC | PRN

## 2012-12-30 MED ORDER — 0.9 % SODIUM CHLORIDE (POUR BTL) OPTIME
TOPICAL | Status: DC | PRN
  Administered 2012-12-30: 300 mL

## 2012-12-30 MED ORDER — FENTANYL CITRATE 0.05 MG/ML IJ SOLN
INTRAMUSCULAR | Status: AC
  Filled 2012-12-30: qty 4

## 2012-12-30 MED ORDER — PROPOFOL 10 MG/ML IV BOLUS
INTRAVENOUS | Status: DC | PRN
  Administered 2012-12-30: 180 mg via INTRAVENOUS

## 2012-12-30 MED ORDER — BUPIVACAINE HCL (PF) 0.25 % IJ SOLN: INTRAMUSCULAR | Status: DC | PRN

## 2012-12-30 MED ORDER — FENTANYL CITRATE 0.05 MG/ML IJ SOLN
INTRAMUSCULAR | Status: DC | PRN
  Administered 2012-12-30: 100 ug via INTRAVENOUS
  Administered 2012-12-30: 50 ug via INTRAVENOUS

## 2012-12-30 MED ORDER — BUPIVACAINE HCL (PF) 0.25 % IJ SOLN
INTRAMUSCULAR | Status: AC
  Filled 2012-12-30: qty 30

## 2012-12-30 MED ORDER — OXYCODONE-ACETAMINOPHEN 5-325 MG PO TABS
1.0000 | ORAL_TABLET | ORAL | Status: DC | PRN
Start: 2012-12-30 — End: 2012-12-30

## 2012-12-30 MED ORDER — LIDOCAINE HCL (CARDIAC) 20 MG/ML IV SOLN
INTRAVENOUS | Status: DC | PRN
  Administered 2012-12-30: 30 mg via INTRAVENOUS

## 2012-12-30 MED ORDER — ONDANSETRON HCL 4 MG/2ML IJ SOLN
INTRAMUSCULAR | Status: DC | PRN
  Administered 2012-12-30: 4 mg via INTRAVENOUS

## 2012-12-30 MED ORDER — LIDOCAINE HCL (PF) 1 % IJ SOLN
INTRAMUSCULAR | Status: AC
  Filled 2012-12-30: qty 30

## 2012-12-30 MED ORDER — DEXAMETHASONE SODIUM PHOSPHATE 10 MG/ML IJ SOLN
INTRAMUSCULAR | Status: DC | PRN
  Administered 2012-12-30: 5 mg via INTRAVENOUS

## 2012-12-30 MED ORDER — MIDAZOLAM HCL 2 MG/2ML IJ SOLN
INTRAMUSCULAR | Status: AC
  Filled 2012-12-30: qty 2

## 2012-12-30 MED ORDER — BUPIVACAINE-EPINEPHRINE PF 0.5-1:200000 % IJ SOLN
INTRAMUSCULAR | Status: AC
  Filled 2012-12-30: qty 30

## 2012-12-30 MED ORDER — LACTATED RINGERS IV SOLN
INTRAVENOUS | Status: DC
  Administered 2012-12-30: 07:00:00 via INTRAVENOUS
  Administered 2012-12-30: 20 mL/h via INTRAVENOUS

## 2012-12-30 MED ORDER — BUPIVACAINE-EPINEPHRINE PF 0.25-1:200000 % IJ SOLN
INTRAMUSCULAR | Status: AC
  Filled 2012-12-30: qty 30

## 2012-12-30 MED ORDER — CEFAZOLIN SODIUM-DEXTROSE 2-3 GM-% IV SOLR
2.0000 g | INTRAVENOUS | Status: AC
  Administered 2012-12-30: 2 g via INTRAVENOUS

## 2012-12-30 MED ORDER — KETOROLAC TROMETHAMINE 30 MG/ML IJ SOLN: 15.0000 mg | Freq: Once | INTRAMUSCULAR | Status: DC | PRN

## 2012-12-30 MED ORDER — ONDANSETRON HCL 4 MG/2ML IJ SOLN: 4.0000 mg | Freq: Once | INTRAMUSCULAR | Status: DC | PRN

## 2012-12-30 MED ORDER — CEFAZOLIN SODIUM 1-5 GM-% IV SOLN
INTRAVENOUS | Status: AC
  Filled 2012-12-30: qty 100

## 2012-12-30 MED ORDER — OXYCODONE-ACETAMINOPHEN 10-325 MG PO TABS: 1.0000 | ORAL_TABLET | Freq: Four times a day (QID) | ORAL | Status: DC | PRN

## 2012-12-30 SURGICAL SUPPLY — 50 items
BANDAGE GAUZE ELAST BULKY 4 IN (GAUZE/BANDAGES/DRESSINGS) ×4 IMPLANT
BLADE MINI RND TIP GREEN BEAV (BLADE) IMPLANT
BLADE SURG 15 STRL LF DISP TIS (BLADE) ×1 IMPLANT
BLADE SURG 15 STRL SS (BLADE) ×2
BNDG CMPR 9X4 STRL LF SNTH (GAUZE/BANDAGES/DRESSINGS) ×1
BNDG COHESIVE 4X5 TAN STRL (GAUZE/BANDAGES/DRESSINGS) ×2 IMPLANT
BNDG ESMARK 4X9 LF (GAUZE/BANDAGES/DRESSINGS) ×1 IMPLANT
CHLORAPREP W/TINT 26ML (MISCELLANEOUS) ×2 IMPLANT
CORDS BIPOLAR (ELECTRODE) IMPLANT
COVER MAYO STAND STRL (DRAPES) ×2 IMPLANT
COVER TABLE BACK 60X90 (DRAPES) ×2 IMPLANT
CUFF TOURNIQUET SINGLE 18IN (TOURNIQUET CUFF) ×1 IMPLANT
DRAIN PENROSE 1/2X12 LTX STRL (WOUND CARE) IMPLANT
DRAIN PENROSE 1/4X12 LTX STRL (WOUND CARE) IMPLANT
DRAPE EXTREMITY T 121X128X90 (DRAPE) ×2 IMPLANT
DRAPE SURG 17X23 STRL (DRAPES) ×2 IMPLANT
DRAPE U-SHAPE 47X51 STRL (DRAPES) IMPLANT
DRSG EMULSION OIL 3X3 NADH (GAUZE/BANDAGES/DRESSINGS) ×2 IMPLANT
ELECT REM PT RETURN 9FT ADLT (ELECTROSURGICAL) ×2
ELECTRODE REM PT RTRN 9FT ADLT (ELECTROSURGICAL) ×1 IMPLANT
GLOVE BIO SURGEON STRL SZ 6.5 (GLOVE) ×1 IMPLANT
GLOVE BIO SURGEON STRL SZ7.5 (GLOVE) ×2 IMPLANT
GLOVE BIOGEL PI IND STRL 7.0 (GLOVE) IMPLANT
GLOVE BIOGEL PI IND STRL 8 (GLOVE) ×1 IMPLANT
GLOVE BIOGEL PI INDICATOR 7.0 (GLOVE) ×1
GLOVE BIOGEL PI INDICATOR 8 (GLOVE) ×1
GLOVE EXAM NITRILE MD LF STRL (GLOVE) ×1 IMPLANT
GOWN PREVENTION PLUS XLARGE (GOWN DISPOSABLE) ×4 IMPLANT
LOOP VESSEL MAXI BLUE (MISCELLANEOUS) IMPLANT
NDL HYPO 25X1 1.5 SAFETY (NEEDLE) IMPLANT
NEEDLE HYPO 25X1 1.5 SAFETY (NEEDLE) ×2 IMPLANT
NS IRRIG 1000ML POUR BTL (IV SOLUTION) ×2 IMPLANT
PACK BASIN DAY SURGERY FS (CUSTOM PROCEDURE TRAY) ×2 IMPLANT
PADDING CAST ABS 4INX4YD NS (CAST SUPPLIES) ×1
PADDING CAST ABS COTTON 4X4 ST (CAST SUPPLIES) IMPLANT
PENCIL BUTTON HOLSTER BLD 10FT (ELECTRODE) ×2 IMPLANT
RUBBERBAND STERILE (MISCELLANEOUS) IMPLANT
SLING ARM FOAM STRAP LRG (SOFTGOODS) ×1 IMPLANT
SPONGE GAUZE 4X4 12PLY (GAUZE/BANDAGES/DRESSINGS) ×2 IMPLANT
STOCKINETTE 4X48 STRL (DRAPES) ×2 IMPLANT
SUT ETHILON 8 0 BV130 4 (SUTURE) IMPLANT
SUT VIC AB 0 SH 27 (SUTURE) IMPLANT
SUT VIC AB 2-0 SH 27 (SUTURE)
SUT VIC AB 2-0 SH 27XBRD (SUTURE) IMPLANT
SUT VICRYL RAPIDE 4/0 PS 2 (SUTURE) ×1 IMPLANT
SYR BULB 3OZ (MISCELLANEOUS) ×2 IMPLANT
SYRINGE 10CC LL (SYRINGE) ×1 IMPLANT
TOWEL OR 17X24 6PK STRL BLUE (TOWEL DISPOSABLE) ×2 IMPLANT
TOWEL OR NON WOVEN STRL DISP B (DISPOSABLE) ×2 IMPLANT
UNDERPAD 30X30 INCONTINENT (UNDERPADS AND DIAPERS) ×2 IMPLANT

## 2012-12-30 NOTE — Interval H&P Note (Signed)
History and Physical Interval Note:  12/30/2012 7:35 AM  Jeff Cunningham  has presented today for surgery, with the diagnosis of RIGHT ULNAR NEUROPATHY   The various methods of treatment have been discussed with the patient and family. After consideration of risks, benefits and other options for treatment, the patient has consented to  Procedure(s): RIGHT ULNAR NEUROPLASTY AT ELBOW  (Right) as a surgical intervention .  The patient's history has been reviewed, patient examined, no change in status, stable for surgery.  I have reviewed the patient's chart and labs.  Questions were answered to the patient's satisfaction.     Alby Schwabe A.

## 2012-12-30 NOTE — Op Note (Signed)
12/30/2012  7:35 AM  PATIENT:  Jeff Cunningham  62 y.o. male  PRE-OPERATIVE DIAGNOSIS:  Right ulnar neuropathy at the elbow  POST-OPERATIVE DIAGNOSIS:  Same  PROCEDURE:  Right ulnar neuroplasty at the elbow (decompression in situ)  SURGEON: Cliffton Asters. Janee Morn, MD  PHYSICIAN ASSISTANT: None  ANESTHESIA:  general  SPECIMENS:  None  DRAINS:   None  PREOPERATIVE INDICATIONS:  Jeff Cunningham is a  62 y.o. male with a diagnosis of right ulnar neuropathy at the elbow who failed conservative measures and elected for surgical management.    The risks benefits and alternatives were discussed with the patient preoperatively including but not limited to the risks of infection, bleeding, nerve injury, cardiopulmonary complications, the need for revision surgery, among others, and the patient verbalized understanding and consented to proceed.  OPERATIVE IMPLANTS: None  OPERATIVE FINDINGS: Site of compression appeared to be at the leading edge of the cubital tunnel proper, with some enlargement of the nerve proximal to this and hyperirritability to manipulation.  OPERATIVE PROCEDURE:  After receiving prophylactic antibiotics, the patient was escorted to the operative theatre and placed in a supine position. General anesthesia was administered A surgical "time-out" was performed during which the planned procedure, proposed operative site, and the correct patient identity were compared to the operative consent and agreement confirmed by the circulating nurse according to current facility policy.  The exposed skin was prepped with Chloraprep and draped in the usual sterile fashion.  A sterile tourniquet was applied. The limb was exsanguinated with an Esmarch bandage and the tourniquet inflated to approximately higher than systolic BP.  A curvilinear incision was marked and made over the ulnar nerve at the level of the elbow. It was centered between the prominence the medial upper condyle and  the olecranon. The skin was incised sharply with scalpel with care to protect and preserve any crossing cutaneous structures. The subcutaneous structures were elevated in full-thickness fashion and the nerve was identified in the groove where the cubital tunnel was opened on its posterior edge. The site of impingement appeared to be at the proximal edge of this with some narrowing of the nerve at that level and some enlargement of the proximal to that level. It was this level of the nerve that was hyper irritable to manipulation. The nerve however was fully decompressed for at least 10 cm proximal and distal. This was done by elevating the skin and subcutaneous tissues in a full-thickness fashion and then distally the superficial and deep fascia of the FCU was split. Proximally the fascia over the nerve was split and there was still a crossing band whether nerve traversed from the anterior to the posterior compartment and so a second small accessory incision was made on the medial aspect of the upper arm near the tourniquet so that the nerve could be fully decompressed all the way from its course and the anterior compartment down into the posterior compartment. Everything was copiously irrigated and half percent Marcaine with epinephrine was instilled into the skin edges. The elbow was manipulated and the nerve found to be stable in the groove as the arm cycled through flexion and extension. The skin was closed 4-0 Vicryl Rapide deep dermal buried sutures and running subcuticular suture followed by benzoin and Steri-Strips. Tourniquet was deflated a bulky dressing was applied from the axilla to the mid palm. He was awakened and taken to the recovery in stable condition breathing spontaneously  DISPOSITION: He'll be discharged home today with typical  postop instructions, returning in 10-15 days for reevaluation

## 2012-12-30 NOTE — Transfer of Care (Signed)
Immediate Anesthesia Transfer of Care Note  Patient: Jeff Cunningham  Procedure(s) Performed: Procedure(s): RIGHT ULNAR NEUROPLASTY AT ELBOW  (Right)  Patient Location: PACU  Anesthesia Type:General  Level of Consciousness: sedated  Airway & Oxygen Therapy: Patient Spontanous Breathing and Patient connected to face mask oxygen  Post-op Assessment: Report given to PACU RN and Post -op Vital signs reviewed and stable  Post vital signs: Reviewed and stable  Complications: No apparent anesthesia complications

## 2012-12-30 NOTE — Anesthesia Preprocedure Evaluation (Addendum)
Anesthesia Evaluation  Patient identified by MRN, date of birth, ID band Patient awake    Reviewed: Allergy & Precautions, H&P , NPO status , Patient's Chart, lab work & pertinent test results, reviewed documented beta blocker date and time   History of Anesthesia Complications Negative for: history of anesthetic complications  Airway       Dental   Pulmonary neg pulmonary ROS,          Cardiovascular hypertension, Pt. on medications and Pt. on home beta blockers     Neuro/Psych  Neuromuscular disease negative psych ROS   GI/Hepatic   Endo/Other    Renal/GU      Musculoskeletal   Abdominal   Peds  Hematology   Anesthesia Other Findings   Reproductive/Obstetrics                           Anesthesia Physical Anesthesia Plan Anesthesia Quick Evaluation

## 2012-12-30 NOTE — Anesthesia Postprocedure Evaluation (Signed)
  Anesthesia Post-op Note  Patient: Jeff Cunningham  Procedure(s) Performed: Procedure(s): RIGHT ULNAR NEUROPLASTY AT ELBOW  (Right)  Patient Location: PACU  Anesthesia Type:General  Level of Consciousness: awake, alert  and oriented  Airway and Oxygen Therapy: Patient Spontanous Breathing  Post-op Pain: none  Post-op Assessment: Post-op Vital signs reviewed, Patient's Cardiovascular Status Stable, Respiratory Function Stable, Patent Airway and Pain level controlled  Post-op Vital Signs: stable  Complications: No apparent anesthesia complications

## 2012-12-30 NOTE — Anesthesia Procedure Notes (Signed)
Procedure Name: LMA Insertion Date/Time: 12/30/2012 7:45 AM Performed by: Burna Cash Pre-anesthesia Checklist: Patient identified, Emergency Drugs available, Suction available and Patient being monitored Patient Re-evaluated:Patient Re-evaluated prior to inductionOxygen Delivery Method: Circle System Utilized Preoxygenation: Pre-oxygenation with 100% oxygen Intubation Type: IV induction Ventilation: Mask ventilation without difficulty LMA: LMA inserted LMA Size: 5.0 Number of attempts: 1 Airway Equipment and Method: bite block Placement Confirmation: positive ETCO2 Tube secured with: Tape Dental Injury: Teeth and Oropharynx as per pre-operative assessment

## 2012-12-31 ENCOUNTER — Encounter (HOSPITAL_BASED_OUTPATIENT_CLINIC_OR_DEPARTMENT_OTHER): Payer: Self-pay | Admitting: Orthopedic Surgery

## 2013-04-14 DIAGNOSIS — M654 Radial styloid tenosynovitis [de Quervain]: Secondary | ICD-10-CM | POA: Diagnosis not present

## 2013-04-14 DIAGNOSIS — G5600 Carpal tunnel syndrome, unspecified upper limb: Secondary | ICD-10-CM | POA: Diagnosis not present

## 2013-04-23 ENCOUNTER — Other Ambulatory Visit: Payer: Self-pay | Admitting: Orthopedic Surgery

## 2013-05-07 ENCOUNTER — Encounter (HOSPITAL_BASED_OUTPATIENT_CLINIC_OR_DEPARTMENT_OTHER): Payer: Self-pay | Admitting: *Deleted

## 2013-05-07 NOTE — Progress Notes (Signed)
To come in for bmet-ekg-was here 11/14 for rt elbow

## 2013-05-08 ENCOUNTER — Encounter (HOSPITAL_BASED_OUTPATIENT_CLINIC_OR_DEPARTMENT_OTHER)
Admission: RE | Admit: 2013-05-08 | Discharge: 2013-05-08 | Disposition: A | Discharge status: Home / Self Care | Payer: Medicare Other | Source: Ambulatory Visit | Attending: Orthopedic Surgery | Admitting: Orthopedic Surgery

## 2013-05-08 ENCOUNTER — Other Ambulatory Visit: Payer: Self-pay

## 2013-05-08 DIAGNOSIS — Z01810 Encounter for preprocedural cardiovascular examination: Secondary | ICD-10-CM | POA: Diagnosis not present

## 2013-05-08 DIAGNOSIS — Z01812 Encounter for preprocedural laboratory examination: Secondary | ICD-10-CM | POA: Diagnosis not present

## 2013-05-08 LAB — BASIC METABOLIC PANEL
BUN: 13 mg/dL (ref 6–23)
CHLORIDE: 100 meq/L (ref 96–112)
CO2: 25 meq/L (ref 19–32)
Calcium: 9.5 mg/dL (ref 8.4–10.5)
Creatinine, Ser: 0.74 mg/dL (ref 0.50–1.35)
GFR calc Af Amer: >90 mL/min (ref >90)
GFR calc non Af Amer: >90 mL/min (ref >90)
GLUCOSE: 130 mg/dL — AB (ref 70–99)
Potassium: 4.5 mEq/L (ref 3.7–5.3)
SODIUM: 138 meq/L (ref 137–147)

## 2013-05-10 NOTE — H&P (Signed)
Jeff Cunningham is an 63 y.o. male.   CC / Reason for Visit: Bilateral hand symptoms HPI: This patient presents for evaluation of problems involving both upper extremities.  With regard to his right wrist de Quervain's tenosynovitis, he continues to be plagued by, has a wrist wrap that he uses and can demonstrate his stretching exercises.  With regard to the right ulnar neuroplasty performed greater than 3 months ago he reports that these haven't improvement in the nerve on that side, still has some soreness at the elbow when he lays it down on the table, et cetera.  On the left side, he continues to have ulnar neuropathic symptoms. Presenting history follows: This patient is a 63-year-old male who presents for evaluation of his bilateral upper extremities.  He has previously undergone bilateral carpal tunnel release by Dr. Dalldorf, the right in 2007 and the left on 01-16-12.  He reports that his numbness and tingling is presently in the ring and small fingers, greater than one year duration, affecting the ulnar border of the hand both volarly and dorsally.  He also has some achiness and tightness in the small joints that may be related to his underlying gout and reports that Dr. Miller is investigating that with additional blood work.  He reports that he has had to truncate electrodiagnostic evaluation both in 2007 and 2013 because even the nerve conduction study portion of the exam was intolerable.  Past Medical History  Diagnosis Date  . Hypertension   . Diabetes mellitus   . Hyperlipidemia   . GERD (gastroesophageal reflux disease)   . Arthritis   . DDD (degenerative disc disease), cervical   . Wears glasses   . Snores   . Carpal tunnel syndrome     Past Surgical History  Procedure Laterality Date  . Shoulder surgery    . Carpal tunnel release  2008    rt  . Shoulder arthroscopy  1983    left  . Colonoscopy    . Carpal tunnel release  01/16/2012    Procedure: CARPAL TUNNEL RELEASE;   Surgeon: Peter G Dalldorf, MD;  Location: [facility];  Service: Orthopedics;  Laterality: Left;  . Ulnar nerve transposition Right 12/30/2012    Procedure: RIGHT ULNAR NEUROPLASTY AT ELBOW ;  Surgeon: David A Thompson, MD;  Location: [facility];  Service: Orthopedics;  Laterality: Right;    Family History  Problem Relation Age of Onset  . Colon cancer Neg Hx    Social History:  reports that he has never smoked. He has never used smokeless tobacco. He reports that he drinks alcohol. He reports that he does not use illicit drugs.  Allergies:  Allergies  Allergen Reactions  . Iodine Rash    No prescriptions prior to admission    Results for orders placed during the hospital encounter of 05/12/13 (from the past 48 hour(s))  BASIC METABOLIC PANEL     Status: Abnormal   Collection Time    05/08/13 11:00 AM      Result Value Ref Range   Sodium 138  137 - 147 mEq/L   Potassium 4.5  3.7 - 5.3 mEq/L   Chloride 100  96 - 112 mEq/L   CO2 25  19 - 32 mEq/L   Glucose, Bld 130 (*) 70 - 99 mg/dL   BUN 13  6 - 23 mg/dL   Creatinine, Ser 0.74  0.50 - 1.35 mg/dL   Calcium 9.5  8.4 - 10.5 mg/dL     GFR calc non Af Amer >90  >90 mL/min   GFR calc Af Amer >90  >90 mL/min   Comment: (NOTE)     The eGFR has been calculated using the CKD EPI equation.     This calculation has not been validated in all clinical situations.     eGFR's persistently <90 mL/min signify possible Chronic Kidney     Disease.   No results found.  Review of Systems  All other systems reviewed and are negative.    Height 5' 8" (1.727 m), weight 102.059 kg (225 lb). Physical Exam  Constitutional:  WD, WN, NAD HEENT:  NCAT, EOMI Neuro/Psych:  Alert & oriented to person, place, and time; appropriate mood & affect Lymphatic: No generalized UE edema or lymphadenopathy Extremities / MSK:  Both UE are normal with respect to appearance, ranges of motion, joint stability, muscle strength/tone,  sensation, & perfusion except as otherwise noted:  Tender over the first dorsal compartment tendons with positive Finkelstein and EPB stress.  Grip strength in position 2: Right 55/left 60.  On the right side, he can detect a 2.8 3 monofilament across all the digits.  On the left side he can detect 2.83 for the thumb, index, and long, 3.61 ring, 4.31 small  Labs / Xrays:  No radiographic studies obtained today.  Assessment: 1.  Bilateral cubital tunnel syndrome, now status post surgery on the right 2.  Right wrist de Quervain's tenosynovitis  Plan:  I discussed these findings with him.  I reinforced stretching exercises.  He would like to proceed with left sided ulnar neuroplasty at the elbow in a month or so, at which time we can inject his right wrist de Quervain's as well.    The details of the operative procedure were discussed with the patient.  Questions were invited and answered.  In addition to the goal of the procedure, the risks of the procedure to include but not limited to bleeding; infection; damage to the nerves or blood vessels that could result in bleeding, numbness, weakness, chronic pain, and the need for additional procedures; stiffness; the need for revision surgery; and anesthetic risks, the worst of which is death, were reviewed.  No specific outcome was guaranteed or implied.  Informed consent was obtained.  He was provided a prescription today for Percocet at his request to be used pending surgery.   THOMPSON, DAVID A. 05/10/2013, 8:32 AM    

## 2013-05-12 ENCOUNTER — Encounter (HOSPITAL_BASED_OUTPATIENT_CLINIC_OR_DEPARTMENT_OTHER): Payer: Self-pay | Admitting: *Deleted

## 2013-05-12 ENCOUNTER — Encounter (HOSPITAL_BASED_OUTPATIENT_CLINIC_OR_DEPARTMENT_OTHER): Payer: Medicare Other | Admitting: Anesthesiology

## 2013-05-12 ENCOUNTER — Ambulatory Visit (HOSPITAL_BASED_OUTPATIENT_CLINIC_OR_DEPARTMENT_OTHER): Payer: Medicare Other | Admitting: Anesthesiology

## 2013-05-12 ENCOUNTER — Ambulatory Visit (HOSPITAL_BASED_OUTPATIENT_CLINIC_OR_DEPARTMENT_OTHER)
Admission: RE | Admit: 2013-05-12 | Discharge: 2013-05-12 | Disposition: A | Discharge status: Home / Self Care | Payer: Medicare Other | Source: Ambulatory Visit | Attending: Orthopedic Surgery | Admitting: Orthopedic Surgery

## 2013-05-12 ENCOUNTER — Encounter (HOSPITAL_BASED_OUTPATIENT_CLINIC_OR_DEPARTMENT_OTHER)
Admission: RE | Disposition: A | Discharge status: Home / Self Care | Payer: Self-pay | Source: Ambulatory Visit | Attending: Orthopedic Surgery

## 2013-05-12 DIAGNOSIS — K219 Gastro-esophageal reflux disease without esophagitis: Secondary | ICD-10-CM | POA: Diagnosis not present

## 2013-05-12 DIAGNOSIS — E785 Hyperlipidemia, unspecified: Secondary | ICD-10-CM | POA: Diagnosis not present

## 2013-05-12 DIAGNOSIS — E669 Obesity, unspecified: Secondary | ICD-10-CM | POA: Diagnosis not present

## 2013-05-12 DIAGNOSIS — E119 Type 2 diabetes mellitus without complications: Secondary | ICD-10-CM | POA: Insufficient documentation

## 2013-05-12 DIAGNOSIS — I10 Essential (primary) hypertension: Secondary | ICD-10-CM | POA: Diagnosis not present

## 2013-05-12 DIAGNOSIS — M654 Radial styloid tenosynovitis [de Quervain]: Secondary | ICD-10-CM | POA: Diagnosis not present

## 2013-05-12 DIAGNOSIS — M5030 Other cervical disc degeneration, unspecified cervical region: Secondary | ICD-10-CM | POA: Insufficient documentation

## 2013-05-12 DIAGNOSIS — G5620 Lesion of ulnar nerve, unspecified upper limb: Secondary | ICD-10-CM | POA: Diagnosis not present

## 2013-05-12 HISTORY — PX: STERIOD INJECTION: SHX5046

## 2013-05-12 HISTORY — PX: ULNAR NERVE TRANSPOSITION: SHX2595

## 2013-05-12 LAB — POCT HEMOGLOBIN-HEMACUE: HEMOGLOBIN: 15.2 g/dL (ref 13.0–17.0)

## 2013-05-12 LAB — GLUCOSE, CAPILLARY
Glucose-Capillary: 122 mg/dL — ABNORMAL HIGH (ref 70–99)
Glucose-Capillary: 148 mg/dL — ABNORMAL HIGH (ref 70–99)

## 2013-05-12 SURGERY — ULNAR NERVE DECOMPRESSION/TRANSPOSITION
Anesthesia: General | Site: Wrist | Laterality: Right

## 2013-05-12 MED ORDER — LACTATED RINGERS IV SOLN
INTRAVENOUS | Status: DC
  Administered 2013-05-12: 07:00:00 via INTRAVENOUS

## 2013-05-12 MED ORDER — PROPOFOL 10 MG/ML IV BOLUS
INTRAVENOUS | Status: DC | PRN
  Administered 2013-05-12: 150 mg via INTRAVENOUS

## 2013-05-12 MED ORDER — HYDROCODONE-ACETAMINOPHEN 5-325 MG PO TABS: 1.0000 | ORAL_TABLET | ORAL | Status: DC | PRN

## 2013-05-12 MED ORDER — BUPIVACAINE HCL (PF) 0.5 % IJ SOLN
INTRAMUSCULAR | Status: AC
  Filled 2013-05-12: qty 30

## 2013-05-12 MED ORDER — LACTATED RINGERS IV SOLN
INTRAVENOUS | Status: DC
  Administered 2013-05-12 (×2): via INTRAVENOUS

## 2013-05-12 MED ORDER — CEFAZOLIN SODIUM-DEXTROSE 2-3 GM-% IV SOLR
2.0000 g | INTRAVENOUS | Status: AC
  Administered 2013-05-12: 2 g via INTRAVENOUS

## 2013-05-12 MED ORDER — OXYCODONE-ACETAMINOPHEN 5-325 MG PO TABS: 1.0000 | ORAL_TABLET | ORAL | Status: DC | PRN

## 2013-05-12 MED ORDER — ONDANSETRON HCL 4 MG/2ML IJ SOLN
INTRAMUSCULAR | Status: DC | PRN
  Administered 2013-05-12: 4 mg via INTRAVENOUS

## 2013-05-12 MED ORDER — FENTANYL CITRATE 0.05 MG/ML IJ SOLN: 50.0000 ug | INTRAMUSCULAR | Status: DC | PRN

## 2013-05-12 MED ORDER — FENTANYL CITRATE 0.05 MG/ML IJ SOLN
INTRAMUSCULAR | Status: DC | PRN
  Administered 2013-05-12: 100 ug via INTRAVENOUS

## 2013-05-12 MED ORDER — METHYLPREDNISOLONE ACETATE 40 MG/ML IJ SUSP
INTRAMUSCULAR | Status: AC
  Filled 2013-05-12: qty 1

## 2013-05-12 MED ORDER — BETAMETHASONE SOD PHOS & ACET 6 (3-3) MG/ML IJ SUSP
INTRAMUSCULAR | Status: AC
  Filled 2013-05-12: qty 1

## 2013-05-12 MED ORDER — METHYLPREDNISOLONE ACETATE 80 MG/ML IJ SUSP
INTRAMUSCULAR | Status: AC
  Filled 2013-05-12: qty 1

## 2013-05-12 MED ORDER — BUPIVACAINE-EPINEPHRINE PF 0.25-1:200000 % IJ SOLN
INTRAMUSCULAR | Status: AC
  Filled 2013-05-12: qty 30

## 2013-05-12 MED ORDER — CHLORHEXIDINE GLUCONATE 4 % EX LIQD: 60.0000 mL | Freq: Once | CUTANEOUS | Status: DC

## 2013-05-12 MED ORDER — BUPIVACAINE-EPINEPHRINE PF 0.5-1:200000 % IJ SOLN
INTRAMUSCULAR | Status: AC
  Filled 2013-05-12: qty 30

## 2013-05-12 MED ORDER — HYDROMORPHONE HCL PF 1 MG/ML IJ SOLN: 0.5000 mg | INTRAMUSCULAR | Status: DC | PRN

## 2013-05-12 MED ORDER — BETAMETHASONE SOD PHOS & ACET 6 (3-3) MG/ML IJ SUSP
INTRAMUSCULAR | Status: DC | PRN
  Administered 2013-05-12: 6 mg via INTRA_ARTICULAR

## 2013-05-12 MED ORDER — MIDAZOLAM HCL 2 MG/2ML IJ SOLN
INTRAMUSCULAR | Status: AC
  Filled 2013-05-12: qty 2

## 2013-05-12 MED ORDER — BUPIVACAINE-EPINEPHRINE PF 0.5-1:200000 % IJ SOLN
INTRAMUSCULAR | Status: AC
  Filled 2013-05-12: qty 1.8

## 2013-05-12 MED ORDER — FENTANYL CITRATE 0.05 MG/ML IJ SOLN
INTRAMUSCULAR | Status: AC
  Filled 2013-05-12: qty 4

## 2013-05-12 MED ORDER — CEFAZOLIN SODIUM-DEXTROSE 2-3 GM-% IV SOLR
INTRAVENOUS | Status: AC
  Filled 2013-05-12: qty 50

## 2013-05-12 MED ORDER — LIDOCAINE HCL (CARDIAC) 20 MG/ML IV SOLN
INTRAVENOUS | Status: DC | PRN
  Administered 2013-05-12: 50 mg via INTRAVENOUS

## 2013-05-12 MED ORDER — SODIUM CHLORIDE 0.9 % IR SOLN
Status: DC | PRN
  Administered 2013-05-12: 200 mL

## 2013-05-12 MED ORDER — BUPIVACAINE-EPINEPHRINE PF 0.5-1:200000 % IJ SOLN
INTRAMUSCULAR | Status: DC | PRN
  Administered 2013-05-12: 20 mL

## 2013-05-12 MED ORDER — OXYCODONE-ACETAMINOPHEN 10-325 MG PO TABS: 1.0000 | ORAL_TABLET | Freq: Four times a day (QID) | ORAL | Status: DC | PRN

## 2013-05-12 MED ORDER — MIDAZOLAM HCL 5 MG/5ML IJ SOLN
INTRAMUSCULAR | Status: DC | PRN
  Administered 2013-05-12: 2 mg via INTRAVENOUS

## 2013-05-12 MED ORDER — MIDAZOLAM HCL 2 MG/2ML IJ SOLN: 1.0000 mg | INTRAMUSCULAR | Status: DC | PRN

## 2013-05-12 SURGICAL SUPPLY — 58 items
APL SKNCLS STERI-STRIP NONHPOA (GAUZE/BANDAGES/DRESSINGS)
BENZOIN TINCTURE PRP APPL 2/3 (GAUZE/BANDAGES/DRESSINGS) ×2 IMPLANT
BLADE MINI RND TIP GREEN BEAV (BLADE) IMPLANT
BLADE SURG 15 STRL LF DISP TIS (BLADE) ×2 IMPLANT
BLADE SURG 15 STRL SS (BLADE) ×4
BNDG CMPR 9X4 STRL LF SNTH (GAUZE/BANDAGES/DRESSINGS) ×2
BNDG COHESIVE 4X5 TAN STRL (GAUZE/BANDAGES/DRESSINGS) ×4 IMPLANT
BNDG ESMARK 4X9 LF (GAUZE/BANDAGES/DRESSINGS) ×2 IMPLANT
BNDG GAUZE ELAST 4 BULKY (GAUZE/BANDAGES/DRESSINGS) ×8 IMPLANT
CHLORAPREP W/TINT 26ML (MISCELLANEOUS) ×4 IMPLANT
CLOSURE WOUND 1/2 X4 (GAUZE/BANDAGES/DRESSINGS)
CORDS BIPOLAR (ELECTRODE) ×4 IMPLANT
COVER MAYO STAND STRL (DRAPES) ×4 IMPLANT
COVER TABLE BACK 60X90 (DRAPES) ×4 IMPLANT
CUFF TOURNIQUET SINGLE 18IN (TOURNIQUET CUFF) IMPLANT
DRAIN PENROSE 1/2X12 LTX STRL (WOUND CARE) IMPLANT
DRAIN PENROSE 1/4X12 LTX STRL (WOUND CARE) IMPLANT
DRAPE EXTREMITY T 121X128X90 (DRAPE) ×4 IMPLANT
DRAPE SURG 17X23 STRL (DRAPES) ×4 IMPLANT
DRAPE U-SHAPE 47X51 STRL (DRAPES) IMPLANT
DRSG EMULSION OIL 3X3 NADH (GAUZE/BANDAGES/DRESSINGS) ×4 IMPLANT
GLOVE BIO SURGEON STRL SZ7.5 (GLOVE) ×4 IMPLANT
GLOVE BIOGEL PI IND STRL 6.5 (GLOVE) IMPLANT
GLOVE BIOGEL PI IND STRL 7.0 (GLOVE) IMPLANT
GLOVE BIOGEL PI IND STRL 8 (GLOVE) ×2 IMPLANT
GLOVE BIOGEL PI INDICATOR 6.5 (GLOVE) ×2
GLOVE BIOGEL PI INDICATOR 7.0 (GLOVE) ×2
GLOVE BIOGEL PI INDICATOR 8 (GLOVE) ×2
GLOVE EXAM NITRILE MD LF STRL (GLOVE) ×2 IMPLANT
GOWN STRL REUS W/ TWL LRG LVL3 (GOWN DISPOSABLE) ×4 IMPLANT
GOWN STRL REUS W/TWL LRG LVL3 (GOWN DISPOSABLE) ×8
LOOP VESSEL MAXI BLUE (MISCELLANEOUS) IMPLANT
NDL HYPO 25X1 1.5 SAFETY (NEEDLE) IMPLANT
NEEDLE HYPO 25X1 1.5 SAFETY (NEEDLE) ×4 IMPLANT
NS IRRIG 1000ML POUR BTL (IV SOLUTION) ×4 IMPLANT
PACK BASIN DAY SURGERY FS (CUSTOM PROCEDURE TRAY) ×4 IMPLANT
PADDING CAST ABS 4INX4YD NS (CAST SUPPLIES) ×2
PADDING CAST ABS COTTON 4X4 ST (CAST SUPPLIES) IMPLANT
RUBBERBAND STERILE (MISCELLANEOUS) IMPLANT
SLING ARM LRG ADULT FOAM STRAP (SOFTGOODS) IMPLANT
SLING ARM MED ADULT FOAM STRAP (SOFTGOODS) IMPLANT
SLING ARM XL FOAM STRAP (SOFTGOODS) IMPLANT
SPONGE GAUZE 4X4 12PLY (GAUZE/BANDAGES/DRESSINGS) ×4 IMPLANT
STOCKINETTE 4X48 STRL (DRAPES) ×4 IMPLANT
STRIP CLOSURE SKIN 1/2X4 (GAUZE/BANDAGES/DRESSINGS) ×2 IMPLANT
SUT ETHILON 8 0 BV130 4 (SUTURE) IMPLANT
SUT VIC AB 0 SH 27 (SUTURE) IMPLANT
SUT VIC AB 2-0 SH 27 (SUTURE)
SUT VIC AB 2-0 SH 27XBRD (SUTURE) IMPLANT
SUT VIC AB 3-0 SH 27 (SUTURE) ×4
SUT VIC AB 3-0 SH 27X BRD (SUTURE) ×2 IMPLANT
SUT VICRYL RAPIDE 4-0 (SUTURE) IMPLANT
SUT VICRYL RAPIDE 4/0 PS 2 (SUTURE) ×2 IMPLANT
SYR BULB 3OZ (MISCELLANEOUS) ×4 IMPLANT
SYRINGE 10CC LL (SYRINGE) ×2 IMPLANT
TOWEL OR 17X24 6PK STRL BLUE (TOWEL DISPOSABLE) ×4 IMPLANT
TOWEL OR NON WOVEN STRL DISP B (DISPOSABLE) ×2 IMPLANT
UNDERPAD 30X30 INCONTINENT (UNDERPADS AND DIAPERS) ×4 IMPLANT

## 2013-05-12 NOTE — Op Note (Signed)
05/12/2013  7:33 AM  PATIENT:  Jeff Cunningham  63 y.o. male  PRE-OPERATIVE DIAGNOSIS:  Left ulnar neuropathy at the elbow and right wrist de Quervain's tenosynovitis  POST-OPERATIVE DIAGNOSIS:  Same  PROCEDURE:  Left ulnar neuroplasty at the elbow, decompression in situ, and right wrist first dorsal compartment steroid injection  SURGEON: Rayvon Char. Grandville Silos, MD  PHYSICIAN ASSISTANT: None  ANESTHESIA:  general  SPECIMENS:  None  DRAINS:   None  PREOPERATIVE INDICATIONS:  Jeff Cunningham is a  63 y.o. male with left ulnar neuropathy the elbow and right wrist de Quervain's tenosynovitis. He has failed nonoperative management on the left side, and wished to defer steroid injection until under general anesthesia on the right wrist.  The risks benefits and alternatives were discussed with the patient preoperatively including but not limited to the risks of infection, bleeding, nerve injury, cardiopulmonary complications, the need for revision surgery, among others, and the patient verbalized understanding and consented to proceed.  OPERATIVE IMPLANTS: None  OPERATIVE PROCEDURE:  After receiving prophylactic antibiotics, the patient was escorted to the operative theatre and placed in a supine position. General anesthesia was administered. A surgical "time-out" was performed during which the planned procedure, proposed operative site, and the correct patient identity were compared to the operative consent and agreement confirmed by the circulating nurse according to current facility policy. One mL of Celestone was injected into the right first dorsal compartment using aseptic technique. The swelling that ensued was a long confines of the first dorsal compartment, indicating the injection was within the compartment rather than subcutaneous. On the left side, the exposed skin was prepped with Chloraprep and draped in the usual sterile fashion.  A sterile tourniquet was applied. The limb was  exsanguinated with an Esmarch bandage and the tourniquet inflated to approximately 126mmHg higher than systolic BP.  A curvilinear incision was made over the ulnar nerve at the elbow, centered between the prominence of the medial epicondyle and the olecranon. It was roughly 3-4 inches in length. The skin was incised sharply with a scalpel, subcutaneous tissues were dissected with blunt spreading dissection with care to try to protect and preserve the crossing cutaneous structures. The nerve was identified in the rectal condylar groove region and the cubital tunnel proper was released near its posterior edge to try to help prevent nerve subluxation. Distally, the FCU fascia was split both deep and superficial and the nerve traced into the FCU were was free of kinking and obstruction. FCU branches were identified and protected. Proximally the nerve was followed in decompressed for 10 cm. There is an additional crossing band at that level and so rather than extending the incision, a separate incision was made on the medial aspect of the upper inner arm to allow for full decompression at that level. The nerve was stable in the groove at the elbow was ranged. Tourniquet was released and additional hemostasis obtained and the wound is copiously irrigated. The wounds were closed with 3-0 Vicryl deep dermal buried sutures followed by running 4-0 Vicryl horizontal mattress sutures and the skin and a total of 20 mL's of half percent Marcaine with epinephrine was instilled around the incisions for postoperative hemostasis and pain control. A bulky dressing was applied he was taken to the recovery room stable condition, breathing spontaneously  DISPOSITION: He'll be discharged home today with typical postop instructions, returning in 10-15 days for reassessment.

## 2013-05-12 NOTE — Interval H&P Note (Signed)
History and Physical Interval Note:  05/12/2013 7:32 AM  Jeff Cunningham  has presented today for surgery, with the diagnosis of LEFT ULNAR NEUROPATHY AT [place]  The various methods of treatment have been discussed with the patient and family. After consideration of risks, benefits and other options for treatment, the patient has consented to  Procedure(s): LEFT ULNAR NEUROPLASTY AT ELBOW (Left)   RIGHT WRIST DEQERVANIS INJECTION (Right) as a surgical intervention .  The patient's history has been reviewed, patient examined, no change in status, stable for surgery.  I have reviewed the patient's chart and labs.  Questions were answered to the patient's satisfaction.     Meily Glowacki A.

## 2013-05-12 NOTE — Anesthesia Postprocedure Evaluation (Signed)
Anesthesia Post Note  Patient: Jeff Cunningham  Procedure(s) Performed: Procedure(s) (LRB): LEFT ULNAR NEUROPLASTY AT ELBOW (Left)   RIGHT WRIST DEQUERVAINS INJECTION (Right)  Anesthesia type: General  Patient location: PACU  Post pain: Pain level controlled and Adequate analgesia  Post assessment: Post-op Vital signs reviewed, Patient's Cardiovascular Status Stable, Respiratory Function Stable, Patent Airway and Pain level controlled  Last Vitals:  Filed Vitals:   05/12/13 0900  BP: 125/58  Pulse: 59  Temp:   Resp: 15    Post vital signs: Reviewed and stable  Level of consciousness: awake, alert  and oriented  Complications: No apparent anesthesia complications

## 2013-05-12 NOTE — Anesthesia Procedure Notes (Signed)
Procedure Name: LMA Insertion Date/Time: 05/12/2013 7:44 AM Performed by: Lyndee Leo Pre-anesthesia Checklist: Patient identified, Emergency Drugs available, Suction available and Patient being monitored Patient Re-evaluated:Patient Re-evaluated prior to inductionOxygen Delivery Method: Circle System Utilized Preoxygenation: Pre-oxygenation with 100% oxygen Intubation Type: IV induction Ventilation: Mask ventilation without difficulty LMA: LMA inserted LMA Size: 4.0 Number of attempts: 1 Airway Equipment and Method: bite block Placement Confirmation: positive ETCO2 Tube secured with: Tape Dental Injury: Teeth and Oropharynx as per pre-operative assessment

## 2013-05-12 NOTE — Anesthesia Preprocedure Evaluation (Signed)
Anesthesia Evaluation  Patient identified by MRN, date of birth, ID band Patient awake    Reviewed: Allergy & Precautions, H&P , NPO status , Patient's Chart, lab work & pertinent test results  Airway Mallampati: II  Neck ROM: full    Dental   Pulmonary          Cardiovascular hypertension,     Neuro/Psych  Neuromuscular disease    GI/Hepatic GERD-  ,  Endo/Other  diabetes, Type 2obese  Renal/GU      Musculoskeletal  (+) Arthritis -,   Abdominal   Peds  Hematology   Anesthesia Other Findings   Reproductive/Obstetrics                           Anesthesia Physical Anesthesia Plan  ASA: II  Anesthesia Plan: General   Post-op Pain Management:    Induction: Intravenous  Airway Management Planned: LMA  Additional Equipment:   Intra-op Plan:   Post-operative Plan:   Informed Consent: I have reviewed the patients History and Physical, chart, labs and discussed the procedure including the risks, benefits and alternatives for the proposed anesthesia with the patient or authorized representative who has indicated his/her understanding and acceptance.     Plan Discussed with: CRNA, Anesthesiologist and Surgeon  Anesthesia Plan Comments:         Anesthesia Quick Evaluation

## 2013-05-12 NOTE — Transfer of Care (Signed)
Immediate Anesthesia Transfer of Care Note  Patient: Jeff Cunningham  Procedure(s) Performed: Procedure(s): LEFT ULNAR NEUROPLASTY AT ELBOW (Left)   RIGHT WRIST DEQUERVAINS INJECTION (Right)  Patient Location: PACU  Anesthesia Type:General  Level of Consciousness: awake, sedated and patient cooperative  Airway & Oxygen Therapy: Patient Spontanous Breathing and Patient connected to face mask oxygen  Post-op Assessment: Report given to PACU RN and Post -op Vital signs reviewed and stable  Post vital signs: Reviewed and stable  Complications: No apparent anesthesia complications

## 2013-05-12 NOTE — Discharge Instructions (Addendum)
Discharge Instructions   You have a light dressing on your hand and arm.  You may begin gentle motion of your fingers and hand immediately, but you should not do any heavy lifting or gripping.  Elevate your hand to reduce pain & swelling of the digits.  Ice over the operative site may be helpful to reduce pain & swelling.  DO NOT USE HEAT. Pain medicine has been prescribed for you.  Use your medicine as needed over the first 48 hours, and then you can begin to taper your use. You may use Tylenol in place of your prescribed pain medication, but not IN ADDITION to it. Leave the dressing in place until you return for your next appointment.  Keep the bandage clean and dry.  You may drive a car when you are off of prescription pain medications and can safely control your vehicle with both hands. We will address whether therapy will be required or not when you return to the office. You may have already made your follow-up appointment when we completed your preop visit.  If not, please call our office today or the next business day to make your return appointment for 10-15 days after surgery.   Please call [phone] during normal business hours or [phone] after hours for any problems. Including the following:  - excessive redness of the incisions - drainage for more than 4 days - fever of more than 101.5 F  *Please note that pain medications will not be refilled after hours or on weekends.

## 2013-05-13 ENCOUNTER — Encounter (HOSPITAL_BASED_OUTPATIENT_CLINIC_OR_DEPARTMENT_OTHER): Payer: Self-pay | Admitting: Orthopedic Surgery

## 2013-06-05 DIAGNOSIS — E119 Type 2 diabetes mellitus without complications: Secondary | ICD-10-CM | POA: Diagnosis not present

## 2013-06-05 DIAGNOSIS — E669 Obesity, unspecified: Secondary | ICD-10-CM | POA: Diagnosis not present

## 2013-06-05 DIAGNOSIS — E7800 Pure hypercholesterolemia, unspecified: Secondary | ICD-10-CM | POA: Diagnosis not present

## 2013-06-05 DIAGNOSIS — I10 Essential (primary) hypertension: Secondary | ICD-10-CM | POA: Diagnosis not present

## 2013-06-05 DIAGNOSIS — M109 Gout, unspecified: Secondary | ICD-10-CM | POA: Diagnosis not present

## 2013-06-05 DIAGNOSIS — N529 Male erectile dysfunction, unspecified: Secondary | ICD-10-CM | POA: Diagnosis not present

## 2013-06-05 DIAGNOSIS — Z125 Encounter for screening for malignant neoplasm of prostate: Secondary | ICD-10-CM | POA: Diagnosis not present

## 2013-08-27 DIAGNOSIS — M25549 Pain in joints of unspecified hand: Secondary | ICD-10-CM | POA: Diagnosis not present

## 2013-08-27 DIAGNOSIS — G609 Hereditary and idiopathic neuropathy, unspecified: Secondary | ICD-10-CM | POA: Diagnosis not present

## 2013-08-27 DIAGNOSIS — E1149 Type 2 diabetes mellitus with other diabetic neurological complication: Secondary | ICD-10-CM | POA: Diagnosis not present

## 2013-08-27 DIAGNOSIS — E669 Obesity, unspecified: Secondary | ICD-10-CM | POA: Diagnosis not present

## 2013-08-27 DIAGNOSIS — Z0000 Encounter for general adult medical examination without abnormal findings: Secondary | ICD-10-CM | POA: Diagnosis not present

## 2013-11-06 DIAGNOSIS — G5620 Lesion of ulnar nerve, unspecified upper limb: Secondary | ICD-10-CM | POA: Diagnosis not present

## 2013-12-30 DIAGNOSIS — E780 Pure hypercholesterolemia: Secondary | ICD-10-CM | POA: Diagnosis not present

## 2013-12-30 DIAGNOSIS — E1140 Type 2 diabetes mellitus with diabetic neuropathy, unspecified: Secondary | ICD-10-CM | POA: Diagnosis not present

## 2013-12-30 DIAGNOSIS — M79643 Pain in unspecified hand: Secondary | ICD-10-CM | POA: Diagnosis not present

## 2013-12-30 DIAGNOSIS — E6609 Other obesity due to excess calories: Secondary | ICD-10-CM | POA: Diagnosis not present

## 2013-12-30 DIAGNOSIS — N5201 Erectile dysfunction due to arterial insufficiency: Secondary | ICD-10-CM | POA: Diagnosis not present

## 2013-12-30 DIAGNOSIS — Z6835 Body mass index (BMI) 35.0-35.9, adult: Secondary | ICD-10-CM | POA: Diagnosis not present

## 2013-12-30 DIAGNOSIS — I10 Essential (primary) hypertension: Secondary | ICD-10-CM | POA: Diagnosis not present

## 2013-12-30 DIAGNOSIS — M109 Gout, unspecified: Secondary | ICD-10-CM | POA: Diagnosis not present

## 2014-04-15 DIAGNOSIS — M47816 Spondylosis without myelopathy or radiculopathy, lumbar region: Secondary | ICD-10-CM | POA: Diagnosis not present

## 2014-07-24 DIAGNOSIS — M25561 Pain in right knee: Secondary | ICD-10-CM | POA: Diagnosis not present

## 2014-07-31 DIAGNOSIS — M25561 Pain in right knee: Secondary | ICD-10-CM | POA: Diagnosis not present

## 2014-08-07 DIAGNOSIS — M25561 Pain in right knee: Secondary | ICD-10-CM | POA: Diagnosis not present

## 2014-08-28 DIAGNOSIS — M25561 Pain in right knee: Secondary | ICD-10-CM | POA: Diagnosis not present

## 2014-09-03 DIAGNOSIS — Z0000 Encounter for general adult medical examination without abnormal findings: Secondary | ICD-10-CM | POA: Diagnosis not present

## 2014-09-03 DIAGNOSIS — M1990 Unspecified osteoarthritis, unspecified site: Secondary | ICD-10-CM | POA: Diagnosis not present

## 2014-09-03 DIAGNOSIS — N5201 Erectile dysfunction due to arterial insufficiency: Secondary | ICD-10-CM | POA: Diagnosis not present

## 2014-09-03 DIAGNOSIS — G4700 Insomnia, unspecified: Secondary | ICD-10-CM | POA: Diagnosis not present

## 2014-09-03 DIAGNOSIS — I10 Essential (primary) hypertension: Secondary | ICD-10-CM | POA: Diagnosis not present

## 2014-09-03 DIAGNOSIS — E780 Pure hypercholesterolemia: Secondary | ICD-10-CM | POA: Diagnosis not present

## 2014-09-03 DIAGNOSIS — M109 Gout, unspecified: Secondary | ICD-10-CM | POA: Diagnosis not present

## 2014-09-03 DIAGNOSIS — E1140 Type 2 diabetes mellitus with diabetic neuropathy, unspecified: Secondary | ICD-10-CM | POA: Diagnosis not present

## 2014-09-03 DIAGNOSIS — Z125 Encounter for screening for malignant neoplasm of prostate: Secondary | ICD-10-CM | POA: Diagnosis not present

## 2015-03-08 DIAGNOSIS — I10 Essential (primary) hypertension: Secondary | ICD-10-CM | POA: Diagnosis not present

## 2015-03-08 DIAGNOSIS — N5201 Erectile dysfunction due to arterial insufficiency: Secondary | ICD-10-CM | POA: Diagnosis not present

## 2015-03-08 DIAGNOSIS — G4700 Insomnia, unspecified: Secondary | ICD-10-CM | POA: Diagnosis not present

## 2015-03-08 DIAGNOSIS — E1140 Type 2 diabetes mellitus with diabetic neuropathy, unspecified: Secondary | ICD-10-CM | POA: Diagnosis not present

## 2015-03-08 DIAGNOSIS — E7800 Pure hypercholesterolemia, unspecified: Secondary | ICD-10-CM | POA: Diagnosis not present

## 2015-03-08 DIAGNOSIS — E6609 Other obesity due to excess calories: Secondary | ICD-10-CM | POA: Diagnosis not present

## 2015-03-08 DIAGNOSIS — M109 Gout, unspecified: Secondary | ICD-10-CM | POA: Diagnosis not present

## 2015-03-08 DIAGNOSIS — M1990 Unspecified osteoarthritis, unspecified site: Secondary | ICD-10-CM | POA: Diagnosis not present

## 2015-03-15 DIAGNOSIS — G5622 Lesion of ulnar nerve, left upper limb: Secondary | ICD-10-CM | POA: Diagnosis not present

## 2015-04-15 ENCOUNTER — Encounter (HOSPITAL_BASED_OUTPATIENT_CLINIC_OR_DEPARTMENT_OTHER): Payer: Self-pay | Admitting: *Deleted

## 2015-04-15 ENCOUNTER — Other Ambulatory Visit: Payer: Self-pay | Admitting: Orthopedic Surgery

## 2015-04-15 DIAGNOSIS — G5622 Lesion of ulnar nerve, left upper limb: Secondary | ICD-10-CM | POA: Diagnosis not present

## 2015-04-15 NOTE — H&P (Signed)
Jeff Cunningham is an 65 y.o. male.   CC / Reason for Visit: Bilateral upper extremity follow up HPI: This patient returns to clinic indicating that the medication as well as time has not helped his left elbow pain and he would like to proceed with surgical intervention.  He indicates that he was taking 200 mg of gabapentin as well as ibuprofen and that it has made his hands feel better overall, but has not stopped the popping sensation as well as the medial elbow tenderness on the left.    HPI 03/15/2015: This patient returns for reevaluation, with complaints of medial tenderness in the left elbow.  He is approaching 2 years following surgery on that side which was a simple decompression in situ of the ulnar nerve.  He is not taking any anti-inflammatories or medicine such as gabapentin.  He denies any numbness or tingling in the ring or small fingers, just tenderness and pain in the medial aspect of the elbow.  He denies any symptoms of nerve instability.  Past Medical History  Diagnosis Date  . Hypertension   . Diabetes mellitus   . Hyperlipidemia   . GERD (gastroesophageal reflux disease)   . Arthritis   . DDD (degenerative disc disease), cervical   . Wears glasses   . Snores   . Carpal tunnel syndrome     Past Surgical History  Procedure Laterality Date  . Shoulder surgery    . Carpal tunnel release  2008    rt  . Shoulder arthroscopy  1983    left  . Colonoscopy    . Carpal tunnel release  01/16/2012    Procedure: CARPAL TUNNEL RELEASE;  Surgeon: Hessie Dibble, MD;  Location: [place];  Service: Orthopedics;  Laterality: Left;  . Ulnar nerve transposition Right 12/30/2012    Procedure: RIGHT ULNAR NEUROPLASTY AT ELBOW ;  Surgeon: Jolyn Nap, MD;  Location: [place];  Service: Orthopedics;  Laterality: Right;  . Ulnar nerve transposition Left 05/12/2013    Procedure: LEFT ULNAR NEUROPLASTY AT ELBOW;  Surgeon: Jolyn Nap, MD;   Location: [place];  Service: Orthopedics;  Laterality: Left;  . Steriod injection Right 05/12/2013    Procedure:   RIGHT WRIST DEQUERVAINS INJECTION;  Surgeon: Jolyn Nap, MD;  Location: [place];  Service: Orthopedics;  Laterality: Right;    Family History  Problem Relation Age of Onset  . Colon cancer Neg Hx    Social History:  reports that he has never smoked. He has never used smokeless tobacco. He reports that he drinks alcohol. He reports that he does not use illicit drugs.  Allergies:  Allergies  Allergen Reactions  . Iodine Rash    No prescriptions prior to admission    No results found for this or any previous visit (from the past 48 hour(s)). No results found.  Review of Systems  All other systems reviewed and are negative.   There were no vitals taken for this visit. Physical Exam  Constitutional:  WD, WN, NAD HEENT:  NCAT, EOMI Neuro/Psych:  Alert & oriented to person, place, and time; appropriate mood & affect Lymphatic: No generalized UE edema or lymphadenopathy Extremities / MSK:  Both UE are normal with respect to appearance, ranges of motion, joint stability, muscle strength/tone, sensation, & perfusion except as otherwise noted:  No clawing or noticeable ulnar atrophy on the left.  There is an area that is still tender with palpation,  with the medial intermuscular septum in the medial epicondyle meet.  Labs / Xrays:  No radiographic studies obtained today.  Assessment:  2 years following left elbow decompression in situ, now with ulnar neuritis and likely symptomatic nerve instability  Plan: The findings were once again discussed with the patient.  He would like to proceed with left ulnar nerve transposition.The details of the operative procedure were discussed with the patient.  Questions were invited and answered.  In addition to the goal of the procedure, the risks of the procedure to include but not limited to  bleeding; infection; damage to the nerves or blood vessels that could result in bleeding, numbness, weakness, chronic pain, and the need for additional procedures; stiffness; the need for revision surgery; and anesthetic risks were reviewed.  No specific outcome was guaranteed or implied.  Informed consent was obtained.   [name] A., MD 04/15/2015, 11:11 AM

## 2015-04-15 NOTE — Progress Notes (Signed)
   04/15/15 1412  OBSTRUCTIVE SLEEP APNEA  Have you ever been diagnosed with sleep apnea through a sleep study? No  Do you snore loudly (loud enough to be heard through closed doors)?  0  Do you often feel tired, fatigued, or sleepy during the daytime (such as falling asleep during driving or talking to someone)? 0  Has anyone observed you stop breathing during your sleep? 0  Do you have, or are you being treated for high blood pressure? 1  BMI more than 35 kg/m2? 1  Age > 50 (1-yes) 1  Neck circumference greater than:Male 16 inches or larger, Male 17inches or larger? 1  Male Gender (Yes=1) 1  Obstructive Sleep Apnea Score 5

## 2015-04-19 ENCOUNTER — Encounter (HOSPITAL_BASED_OUTPATIENT_CLINIC_OR_DEPARTMENT_OTHER)
Admission: RE | Admit: 2015-04-19 | Discharge: 2015-04-19 | Disposition: A | Discharge status: Home / Self Care | Payer: Medicare Other | Source: Ambulatory Visit | Attending: Orthopedic Surgery | Admitting: Orthopedic Surgery

## 2015-04-19 DIAGNOSIS — Z7984 Long term (current) use of oral hypoglycemic drugs: Secondary | ICD-10-CM | POA: Diagnosis not present

## 2015-04-19 DIAGNOSIS — E785 Hyperlipidemia, unspecified: Secondary | ICD-10-CM | POA: Diagnosis not present

## 2015-04-19 DIAGNOSIS — G5622 Lesion of ulnar nerve, left upper limb: Secondary | ICD-10-CM | POA: Diagnosis not present

## 2015-04-19 DIAGNOSIS — E119 Type 2 diabetes mellitus without complications: Secondary | ICD-10-CM | POA: Diagnosis not present

## 2015-04-19 DIAGNOSIS — M25322 Other instability, left elbow: Secondary | ICD-10-CM | POA: Diagnosis present

## 2015-04-19 DIAGNOSIS — I10 Essential (primary) hypertension: Secondary | ICD-10-CM | POA: Diagnosis not present

## 2015-04-19 LAB — BASIC METABOLIC PANEL
Anion gap: 10 (ref 5–15)
BUN: 21 mg/dL — AB (ref 6–20)
CO2: 24 mmol/L (ref 22–32)
CREATININE: 0.99 mg/dL (ref 0.61–1.24)
Calcium: 9.3 mg/dL (ref 8.9–10.3)
Chloride: 107 mmol/L (ref 101–111)
GFR calc Af Amer: >60 mL/min (ref >60)
Glucose, Bld: 127 mg/dL — ABNORMAL HIGH (ref 65–99)
Potassium: 4.4 mmol/L (ref 3.5–5.1)
SODIUM: 141 mmol/L (ref 135–145)

## 2015-04-20 ENCOUNTER — Ambulatory Visit (HOSPITAL_BASED_OUTPATIENT_CLINIC_OR_DEPARTMENT_OTHER)
Admission: RE | Admit: 2015-04-20 | Discharge: 2015-04-20 | Disposition: A | Discharge status: Home / Self Care | Payer: Medicare Other | Source: Ambulatory Visit | Attending: Orthopedic Surgery | Admitting: Orthopedic Surgery

## 2015-04-20 ENCOUNTER — Ambulatory Visit (HOSPITAL_BASED_OUTPATIENT_CLINIC_OR_DEPARTMENT_OTHER): Payer: Medicare Other | Admitting: Certified Registered"

## 2015-04-20 ENCOUNTER — Encounter (HOSPITAL_BASED_OUTPATIENT_CLINIC_OR_DEPARTMENT_OTHER)
Admission: RE | Disposition: A | Discharge status: Home / Self Care | Payer: Self-pay | Source: Ambulatory Visit | Attending: Orthopedic Surgery

## 2015-04-20 ENCOUNTER — Encounter (HOSPITAL_BASED_OUTPATIENT_CLINIC_OR_DEPARTMENT_OTHER): Payer: Self-pay | Admitting: *Deleted

## 2015-04-20 DIAGNOSIS — E119 Type 2 diabetes mellitus without complications: Secondary | ICD-10-CM | POA: Insufficient documentation

## 2015-04-20 DIAGNOSIS — Z7984 Long term (current) use of oral hypoglycemic drugs: Secondary | ICD-10-CM | POA: Diagnosis not present

## 2015-04-20 DIAGNOSIS — I10 Essential (primary) hypertension: Secondary | ICD-10-CM | POA: Diagnosis not present

## 2015-04-20 DIAGNOSIS — E785 Hyperlipidemia, unspecified: Secondary | ICD-10-CM | POA: Insufficient documentation

## 2015-04-20 DIAGNOSIS — G5622 Lesion of ulnar nerve, left upper limb: Secondary | ICD-10-CM | POA: Diagnosis not present

## 2015-04-20 HISTORY — PX: ULNAR NERVE TRANSPOSITION: SHX2595

## 2015-04-20 LAB — GLUCOSE, CAPILLARY
GLUCOSE-CAPILLARY: 108 mg/dL — AB (ref 65–99)
GLUCOSE-CAPILLARY: 113 mg/dL — AB (ref 65–99)

## 2015-04-20 SURGERY — ULNAR NERVE DECOMPRESSION/TRANSPOSITION
Anesthesia: General | Site: Elbow | Laterality: Left

## 2015-04-20 MED ORDER — ONDANSETRON HCL 4 MG/2ML IJ SOLN
INTRAMUSCULAR | Status: AC
  Filled 2015-04-20: qty 2

## 2015-04-20 MED ORDER — BUPIVACAINE-EPINEPHRINE (PF) 0.5% -1:200000 IJ SOLN
INTRAMUSCULAR | Status: AC
  Filled 2015-04-20: qty 30

## 2015-04-20 MED ORDER — MIDAZOLAM HCL 2 MG/2ML IJ SOLN
INTRAMUSCULAR | Status: AC
  Filled 2015-04-20: qty 2

## 2015-04-20 MED ORDER — LIDOCAINE HCL (PF) 1 % IJ SOLN
INTRAMUSCULAR | Status: AC
  Filled 2015-04-20: qty 30

## 2015-04-20 MED ORDER — ONDANSETRON HCL 4 MG/2ML IJ SOLN
INTRAMUSCULAR | Status: DC | PRN
  Administered 2015-04-20: 4 mg via INTRAVENOUS

## 2015-04-20 MED ORDER — OXYCODONE HCL 5 MG PO TABS
ORAL_TABLET | ORAL | Status: AC
  Filled 2015-04-20: qty 1

## 2015-04-20 MED ORDER — OXYCODONE HCL 5 MG/5ML PO SOLN: 5.0000 mg | Freq: Once | ORAL | Status: AC | PRN

## 2015-04-20 MED ORDER — BUPIVACAINE-EPINEPHRINE 0.5% -1:200000 IJ SOLN
INTRAMUSCULAR | Status: DC | PRN
  Administered 2015-04-20: 17 mL

## 2015-04-20 MED ORDER — OXYCODONE HCL 5 MG PO TABS
5.0000 mg | ORAL_TABLET | Freq: Once | ORAL | Status: AC | PRN
  Administered 2015-04-20: 5 mg via ORAL

## 2015-04-20 MED ORDER — 0.9 % SODIUM CHLORIDE (POUR BTL) OPTIME
TOPICAL | Status: DC | PRN
  Administered 2015-04-20: 200 mL

## 2015-04-20 MED ORDER — MEPERIDINE HCL 25 MG/ML IJ SOLN: 6.2500 mg | INTRAMUSCULAR | Status: DC | PRN

## 2015-04-20 MED ORDER — HYDROMORPHONE HCL 1 MG/ML IJ SOLN
INTRAMUSCULAR | Status: AC
  Filled 2015-04-20: qty 1

## 2015-04-20 MED ORDER — SCOPOLAMINE 1 MG/3DAYS TD PT72: 1.0000 | MEDICATED_PATCH | Freq: Once | TRANSDERMAL | Status: DC | PRN

## 2015-04-20 MED ORDER — HYDROMORPHONE HCL 1 MG/ML IJ SOLN
0.2500 mg | INTRAMUSCULAR | Status: DC | PRN
  Administered 2015-04-20 (×3): 0.5 mg via INTRAVENOUS

## 2015-04-20 MED ORDER — MIDAZOLAM HCL 2 MG/2ML IJ SOLN
1.0000 mg | INTRAMUSCULAR | Status: DC | PRN
Start: 2015-04-20 — End: 2015-04-20
  Administered 2015-04-20: 1 mg via INTRAVENOUS

## 2015-04-20 MED ORDER — CEFAZOLIN SODIUM-DEXTROSE 2-3 GM-% IV SOLR
INTRAVENOUS | Status: AC
  Filled 2015-04-20: qty 50

## 2015-04-20 MED ORDER — FENTANYL CITRATE (PF) 100 MCG/2ML IJ SOLN
INTRAMUSCULAR | Status: AC
  Filled 2015-04-20: qty 2

## 2015-04-20 MED ORDER — FENTANYL CITRATE (PF) 100 MCG/2ML IJ SOLN
50.0000 ug | INTRAMUSCULAR | Status: AC | PRN
  Administered 2015-04-20: 50 ug via INTRAVENOUS
  Administered 2015-04-20: 25 ug via INTRAVENOUS
  Administered 2015-04-20: 50 ug via INTRAVENOUS

## 2015-04-20 MED ORDER — DEXAMETHASONE SODIUM PHOSPHATE 10 MG/ML IJ SOLN
INTRAMUSCULAR | Status: AC
  Filled 2015-04-20: qty 1

## 2015-04-20 MED ORDER — CEFAZOLIN SODIUM-DEXTROSE 2-3 GM-% IV SOLR
2.0000 g | INTRAVENOUS | Status: AC
  Administered 2015-04-20: 2 g via INTRAVENOUS

## 2015-04-20 MED ORDER — LACTATED RINGERS IV SOLN: INTRAVENOUS | Status: DC

## 2015-04-20 MED ORDER — PROPOFOL 10 MG/ML IV BOLUS
INTRAVENOUS | Status: DC | PRN
  Administered 2015-04-20: 200 mg via INTRAVENOUS

## 2015-04-20 MED ORDER — PHENYLEPHRINE HCL 10 MG/ML IJ SOLN
INTRAMUSCULAR | Status: DC | PRN
  Administered 2015-04-20: 40 ug via INTRAVENOUS

## 2015-04-20 MED ORDER — LIDOCAINE HCL (CARDIAC) 20 MG/ML IV SOLN
INTRAVENOUS | Status: AC
  Filled 2015-04-20: qty 5

## 2015-04-20 MED ORDER — LIDOCAINE HCL (CARDIAC) 20 MG/ML IV SOLN
INTRAVENOUS | Status: DC | PRN
  Administered 2015-04-20: 60 mg via INTRAVENOUS

## 2015-04-20 MED ORDER — GLYCOPYRROLATE 0.2 MG/ML IJ SOLN: 0.2000 mg | Freq: Once | INTRAMUSCULAR | Status: DC | PRN

## 2015-04-20 MED ORDER — DEXAMETHASONE SODIUM PHOSPHATE 10 MG/ML IJ SOLN
INTRAMUSCULAR | Status: DC | PRN
  Administered 2015-04-20: 4 mg via INTRAVENOUS

## 2015-04-20 MED ORDER — OXYCODONE-ACETAMINOPHEN 5-325 MG PO TABS: 1.0000 | ORAL_TABLET | ORAL | Status: DC | PRN

## 2015-04-20 MED ORDER — LACTATED RINGERS IV SOLN
INTRAVENOUS | Status: DC
  Administered 2015-04-20 (×2): via INTRAVENOUS

## 2015-04-20 SURGICAL SUPPLY — 47 items
APL SKNCLS STERI-STRIP NONHPOA (GAUZE/BANDAGES/DRESSINGS) ×1
BENZOIN TINCTURE PRP APPL 2/3 (GAUZE/BANDAGES/DRESSINGS) ×3 IMPLANT
BLADE SURG 15 STRL LF DISP TIS (BLADE) ×1 IMPLANT
BLADE SURG 15 STRL SS (BLADE) ×3
BNDG CMPR 9X4 STRL LF SNTH (GAUZE/BANDAGES/DRESSINGS) ×1
BNDG COHESIVE 4X5 TAN STRL (GAUZE/BANDAGES/DRESSINGS) ×3 IMPLANT
BNDG ESMARK 4X9 LF (GAUZE/BANDAGES/DRESSINGS) ×2 IMPLANT
BNDG GAUZE ELAST 4 BULKY (GAUZE/BANDAGES/DRESSINGS) ×6 IMPLANT
CHLORAPREP W/TINT 26ML (MISCELLANEOUS) ×3 IMPLANT
CORDS BIPOLAR (ELECTRODE) ×3 IMPLANT
COVER BACK TABLE 60X90IN (DRAPES) ×3 IMPLANT
COVER MAYO STAND STRL (DRAPES) ×3 IMPLANT
CUFF TOURN SGL LL 18 NRW (TOURNIQUET CUFF) ×3 IMPLANT
DRAPE EXTREMITY T 121X128X90 (DRAPE) ×3 IMPLANT
DRAPE SURG 17X23 STRL (DRAPES) ×5 IMPLANT
DRSG ADAPTIC 3X8 NADH LF (GAUZE/BANDAGES/DRESSINGS) ×2 IMPLANT
ELECT REM PT RETURN 9FT ADLT (ELECTROSURGICAL) ×3
ELECTRODE REM PT RTRN 9FT ADLT (ELECTROSURGICAL) IMPLANT
GAUZE SPONGE 4X4 12PLY STRL (GAUZE/BANDAGES/DRESSINGS) ×3 IMPLANT
GLOVE BIO SURGEON STRL SZ7.5 (GLOVE) ×3 IMPLANT
GLOVE BIOGEL PI IND STRL 7.0 (GLOVE) ×1 IMPLANT
GLOVE BIOGEL PI IND STRL 8 (GLOVE) ×1 IMPLANT
GLOVE BIOGEL PI INDICATOR 7.0 (GLOVE) ×6
GLOVE BIOGEL PI INDICATOR 8 (GLOVE) ×2
GLOVE ECLIPSE 6.5 STRL STRAW (GLOVE) ×5 IMPLANT
GOWN STRL REUS W/ TWL LRG LVL3 (GOWN DISPOSABLE) ×2 IMPLANT
GOWN STRL REUS W/TWL LRG LVL3 (GOWN DISPOSABLE) ×6
GOWN STRL REUS W/TWL XL LVL3 (GOWN DISPOSABLE) ×3 IMPLANT
LOOP VESSEL MAXI BLUE (MISCELLANEOUS) ×2 IMPLANT
NDL HYPO 25X1 1.5 SAFETY (NEEDLE) IMPLANT
NEEDLE HYPO 25X1 1.5 SAFETY (NEEDLE) ×3 IMPLANT
NS IRRIG 1000ML POUR BTL (IV SOLUTION) ×3 IMPLANT
PACK BASIN DAY SURGERY FS (CUSTOM PROCEDURE TRAY) ×3 IMPLANT
PADDING CAST ABS 4INX4YD NS (CAST SUPPLIES) ×4
PADDING CAST ABS COTTON 4X4 ST (CAST SUPPLIES) IMPLANT
PENCIL BUTTON HOLSTER BLD 10FT (ELECTRODE) ×2 IMPLANT
SLING ARM XL FOAM STRAP (SOFTGOODS) ×2 IMPLANT
STOCKINETTE 6  STRL (DRAPES) ×2
STOCKINETTE 6 STRL (DRAPES) ×1 IMPLANT
SUT VIC AB 3-0 SH 27 (SUTURE) ×3
SUT VIC AB 3-0 SH 27X BRD (SUTURE) ×1 IMPLANT
SUT VICRYL RAPIDE 4/0 PS 2 (SUTURE) ×3 IMPLANT
SYR BULB 3OZ (MISCELLANEOUS) ×3 IMPLANT
SYRINGE 10CC LL (SYRINGE) ×2 IMPLANT
TOWEL OR 17X24 6PK STRL BLUE (TOWEL DISPOSABLE) ×4 IMPLANT
TOWEL OR NON WOVEN STRL DISP B (DISPOSABLE) ×3 IMPLANT
UNDERPAD 30X30 (UNDERPADS AND DIAPERS) ×3 IMPLANT

## 2015-04-20 NOTE — Interval H&P Note (Signed)
History and Physical Interval Note:  04/20/2015 12:41 PM  Jeff Cunningham  has presented today for surgery, with the diagnosis of LEFT ULNAR NEURITIS G56.22  The various methods of treatment have been discussed with the patient and family. After consideration of risks, benefits and other options for treatment, the patient has consented to  Procedure(s): LEFT ULNAR NERVE TRANSPOSITION (Left) as a surgical intervention .  The patient's history has been reviewed, patient examined, no change in status, stable for surgery.  I have reviewed the patient's chart and labs.  Questions were answered to the patient's satisfaction.     [name] A.

## 2015-04-20 NOTE — Anesthesia Procedure Notes (Signed)
Procedure Name: LMA Insertion Date/Time: 04/20/2015 12:50 PM Performed by: Faiz Weber D Pre-anesthesia Checklist: Patient identified, Emergency Drugs available, Suction available and Patient being monitored Patient Re-evaluated:Patient Re-evaluated prior to inductionOxygen Delivery Method: Circle System Utilized Preoxygenation: Pre-oxygenation with 100% oxygen Intubation Type: IV induction Ventilation: Mask ventilation without difficulty LMA: LMA inserted LMA Size: 4.0 Number of attempts: 1 Airway Equipment and Method: Bite block Placement Confirmation: positive ETCO2 Tube secured with: Tape Dental Injury: Teeth and Oropharynx as per pre-operative assessment

## 2015-04-20 NOTE — Transfer of Care (Signed)
Immediate Anesthesia Transfer of Care Note  Patient: Jeff Cunningham  Procedure(s) Performed: Procedure(s): LEFT ULNAR NERVE TRANSPOSITION (Left)  Patient Location: PACU  Anesthesia Type:General  Level of Consciousness: awake and patient cooperative  Airway & Oxygen Therapy: Patient Spontanous Breathing and Patient connected to face mask oxygen  Post-op Assessment: Report given to RN and Post -op Vital signs reviewed and stable  Post vital signs: Reviewed and stable  Last Vitals:  Filed Vitals:   04/20/15 1112  BP: 114/76  Pulse: 77  Temp: 36.6 C  Resp: 18    Complications: No apparent anesthesia complications

## 2015-04-20 NOTE — Anesthesia Preprocedure Evaluation (Addendum)
Anesthesia Evaluation  Patient identified by MRN, date of birth, ID band Patient awake    Reviewed: Allergy & Precautions, NPO status   Airway Mallampati: I  TM Distance: >3 FB Neck ROM: Full    Dental  (+) Teeth Intact, Dental Advisory Given   Pulmonary    breath sounds clear to auscultation       Cardiovascular hypertension, Pt. on home beta blockers  Rhythm:Regular Rate:Normal     Neuro/Psych  Neuromuscular disease    GI/Hepatic GERD  Medicated,  Endo/Other  diabetes, Well Controlled, Type 2, Oral Hypoglycemic AgentsMorbid obesity  Renal/GU      Musculoskeletal  (+) Arthritis ,   Abdominal   Peds  Hematology   Anesthesia Other Findings   Reproductive/Obstetrics                            Anesthesia Physical Anesthesia Plan  ASA: III  Anesthesia Plan: General   Post-op Pain Management: MAC Combined w/ Regional for Post-op pain   Induction: Intravenous  Airway Management Planned: LMA  Additional Equipment:   Intra-op Plan:   Post-operative Plan: Extubation in OR  Informed Consent: I have reviewed the patients History and Physical, chart, labs and discussed the procedure including the risks, benefits and alternatives for the proposed anesthesia with the patient or authorized representative who has indicated his/her understanding and acceptance.   Dental advisory given  Plan Discussed with: CRNA, Anesthesiologist and Surgeon  Anesthesia Plan Comments:         Anesthesia Quick Evaluation

## 2015-04-20 NOTE — Discharge Instructions (Addendum)
Discharge Instructions   You have a dressing on your hand and arm.  You may begin gentle motion of your fingers and hand immediately, but you should not do any heavy lifting or gripping.  Elevate your hand to reduce pain & swelling of the digits.  Ice over the operative site may be helpful to reduce pain & swelling.  DO NOT USE HEAT. Pain medicine has been prescribed for you.  Use your medicine as needed over the first 48 hours, and then you can begin to taper your use. You may use Tylenol in place of your prescribed pain medication, but not IN ADDITION to it. Leave the dressing in place until returning to clinic, keeping it clean and dry. You may drive a car when you are off of prescription pain medications and can safely control your vehicle with both hands. We will address whether therapy will be required or not when you return to the office. You may have already made your follow-up appointment when we completed your preop visit.  If not, please call our office today or the next business day to make your return appointment for 10-15 days after surgery.   Please call [phone] during normal business hours or [phone] after hours for any problems. Including the following:  - excessive redness of the incisions - drainage for more than 4 days - fever of more than 101.5 F  *Please note that pain medications will not be refilled after hours or on weekends.    Post Anesthesia Home Care Instructions  Activity: Get plenty of rest for the remainder of the day. A responsible adult should stay with you for 24 hours following the procedure.  For the next 24 hours, DO NOT: -Drive a car -Paediatric nurse -Drink alcoholic beverages -Take any medication unless instructed by your physician -Make any legal decisions or sign important papers.  Meals: Start with liquid foods such as gelatin or soup. Progress to regular foods as tolerated. Avoid greasy, spicy, heavy foods. If nausea and/or  vomiting occur, drink only clear liquids until the nausea and/or vomiting subsides. Call your physician if vomiting continues.  Special Instructions/Symptoms: Your throat may feel dry or sore from the anesthesia or the breathing tube placed in your throat during surgery. If this causes discomfort, gargle with warm salt water. The discomfort should disappear within 24 hours.  If you had a scopolamine patch placed behind your ear for the management of post- operative nausea and/or vomiting:  1. The medication in the patch is effective for 72 hours, after which it should be removed.  Wrap patch in a tissue and discard in the trash. Wash hands thoroughly with soap and water. 2. You may remove the patch earlier than 72 hours if you experience unpleasant side effects which may include dry mouth, dizziness or visual disturbances. 3. Avoid touching the patch. Wash your hands with soap and water after contact with the patch.

## 2015-04-20 NOTE — Anesthesia Postprocedure Evaluation (Signed)
Anesthesia Post Note  Patient: [name]  Procedure(s) Performed: Procedure(s) (LRB): LEFT ULNAR NERVE TRANSPOSITION (Left)  Patient location during evaluation: PACU Anesthesia Type: General Level of consciousness: awake and alert Pain management: pain level controlled Vital Signs Assessment: post-procedure vital signs reviewed and stable Respiratory status: spontaneous breathing, nonlabored ventilation and respiratory function stable Cardiovascular status: blood pressure returned to baseline and stable Postop Assessment: no signs of nausea or vomiting Anesthetic complications: no    Last Vitals:  Filed Vitals:   04/20/15 1500 04/20/15 1515  BP: 119/70 120/74  Pulse: 73 72  Temp:    Resp: 15 15    Last Pain:  Filed Vitals:   04/20/15 1520  PainSc: 3                  Delmo Matty A

## 2015-04-20 NOTE — Op Note (Signed)
04/20/2015  12:42 PM  PATIENT:  Kathlen Mody  65 y.o. male  PRE-OPERATIVE DIAGNOSIS:  Left ulnar nerve instability the elbow  POST-OPERATIVE DIAGNOSIS:  Same  PROCEDURE:  Left ulnar nerve neuroplasty with subcutaneous anterior transposition  SURGEON: Rayvon Char. Grandville Silos, MD  PHYSICIAN ASSISTANT: Morley Kos, OPA-C  ANESTHESIA:  general  SPECIMENS:  None  DRAINS:   None  EBL:  less than 50 mL  PREOPERATIVE INDICATIONS:  CLAYTIN SWAGGER is a  65 y.o. male who had previously undergone left ulnar nerve decompression in situ, and now 2 years postoperatively developed some nerve instability with persisting ulnar neuritis.  The risks benefits and alternatives were discussed with the patient preoperatively including but not limited to the risks of infection, bleeding, nerve injury, cardiopulmonary complications, the need for revision surgery, among others, and the patient verbalized understanding and consented to proceed.  OPERATIVE IMPLANTS: None  OPERATIVE PROCEDURE:  After receiving prophylactic antibiotics, the patient was escorted to the operative theatre and placed in a supine position.  General anesthesia was a Company secretary.  A surgical "time-out" was performed during which the planned procedure, proposed operative site, and the correct patient identity were compared to the operative consent and agreement confirmed by the circulating nurse according to current facility policy.  The exposed skin was prepped with ChloraPrep and draped in usual sterile fashion, followed by application of sterile tourniquet.  The limb was exsanguinated with an Esmarch bandage and the tourniquet inflated to approximately 178mmHg higher than systolic BP.  The previous incision was marked, and extended some proximally and distally by about 1-2 inches.  The planned incision site was anesthetized with half percent Marcaine with epinephrine.  The incision was then made sharply with a scalpel, subcutaneous taste  tissues were dissected with blunt spreading dissection.  Care was taken to look for try to protect and preserve the crossing cutaneous nerves, particularly in the proximal forearm at the level of the epicondyle.  The ulnar nerve was identified just proximal to the cubital tunnel proper, and as it was carefully dissected, was found to be narrowed through the cubital tunnel the proximal portions of the FCU.  It was freed from 10 cm proximal to the joint line to its course distal in the FCU, between the 2 heads.  The anterior flap was created.  Medial intermuscular septum was excised.  The nerve was transposed and a broad-based flap from the flexor pronator mass was developed.  The wound was copiously irrigated and the nerve was placed into the transposed position, anterior to the flexor pronator mass.  The skin flap was allowed to rest and is normal position and 3-0 Vicryl suture was used to secure the retention fascial flap to the superficial fascia to prevent posterior subluxation of the nerve.  There was capacious space for the nerve and it was not kinked.  Skin was then reapproximated with 3-0 Vicryl deep dermal buried interrupted sutures and running 4-0 Vicryl Rapide horizontal mattress sutures in the skin.  Long-arm dressing was applied, and he was placed into a sling.  He was taken to the recovery room stable condition, breathing spontaneously  DISPOSITION: He'll be discharged home with typical instructions, returning in 10-15 days.

## 2015-04-21 ENCOUNTER — Encounter (HOSPITAL_BASED_OUTPATIENT_CLINIC_OR_DEPARTMENT_OTHER): Payer: Self-pay | Admitting: Orthopedic Surgery

## 2015-05-04 DIAGNOSIS — G5622 Lesion of ulnar nerve, left upper limb: Secondary | ICD-10-CM | POA: Diagnosis not present

## 2015-05-07 DIAGNOSIS — M47812 Spondylosis without myelopathy or radiculopathy, cervical region: Secondary | ICD-10-CM | POA: Diagnosis not present

## 2015-05-07 DIAGNOSIS — E119 Type 2 diabetes mellitus without complications: Secondary | ICD-10-CM | POA: Diagnosis not present

## 2015-05-07 DIAGNOSIS — M47816 Spondylosis without myelopathy or radiculopathy, lumbar region: Secondary | ICD-10-CM | POA: Diagnosis not present

## 2015-05-07 DIAGNOSIS — M791 Myalgia: Secondary | ICD-10-CM | POA: Diagnosis not present

## 2015-05-07 DIAGNOSIS — M159 Polyosteoarthritis, unspecified: Secondary | ICD-10-CM | POA: Diagnosis not present

## 2015-05-07 DIAGNOSIS — M1990 Unspecified osteoarthritis, unspecified site: Secondary | ICD-10-CM | POA: Diagnosis not present

## 2015-05-07 DIAGNOSIS — G894 Chronic pain syndrome: Secondary | ICD-10-CM | POA: Diagnosis not present

## 2015-05-10 DIAGNOSIS — M47816 Spondylosis without myelopathy or radiculopathy, lumbar region: Secondary | ICD-10-CM | POA: Diagnosis not present

## 2015-05-10 DIAGNOSIS — M47812 Spondylosis without myelopathy or radiculopathy, cervical region: Secondary | ICD-10-CM | POA: Diagnosis not present

## 2015-06-02 DIAGNOSIS — M25569 Pain in unspecified knee: Secondary | ICD-10-CM | POA: Diagnosis not present

## 2015-06-02 DIAGNOSIS — M791 Myalgia: Secondary | ICD-10-CM | POA: Diagnosis not present

## 2015-06-02 DIAGNOSIS — M25519 Pain in unspecified shoulder: Secondary | ICD-10-CM | POA: Diagnosis not present

## 2015-06-02 DIAGNOSIS — M170 Bilateral primary osteoarthritis of knee: Secondary | ICD-10-CM | POA: Diagnosis not present

## 2015-06-02 DIAGNOSIS — M19041 Primary osteoarthritis, right hand: Secondary | ICD-10-CM | POA: Diagnosis not present

## 2015-06-02 DIAGNOSIS — M19011 Primary osteoarthritis, right shoulder: Secondary | ICD-10-CM | POA: Diagnosis not present

## 2015-06-02 DIAGNOSIS — M2550 Pain in unspecified joint: Secondary | ICD-10-CM | POA: Diagnosis not present

## 2015-06-02 DIAGNOSIS — M19042 Primary osteoarthritis, left hand: Secondary | ICD-10-CM | POA: Diagnosis not present

## 2015-06-02 DIAGNOSIS — M25512 Pain in left shoulder: Secondary | ICD-10-CM | POA: Diagnosis not present

## 2015-06-07 DIAGNOSIS — M2550 Pain in unspecified joint: Secondary | ICD-10-CM | POA: Diagnosis not present

## 2015-06-08 DIAGNOSIS — G894 Chronic pain syndrome: Secondary | ICD-10-CM | POA: Diagnosis not present

## 2015-06-08 DIAGNOSIS — M159 Polyosteoarthritis, unspecified: Secondary | ICD-10-CM | POA: Diagnosis not present

## 2015-06-08 DIAGNOSIS — M791 Myalgia: Secondary | ICD-10-CM | POA: Diagnosis not present

## 2015-06-08 DIAGNOSIS — M1990 Unspecified osteoarthritis, unspecified site: Secondary | ICD-10-CM | POA: Diagnosis not present

## 2015-06-08 DIAGNOSIS — E119 Type 2 diabetes mellitus without complications: Secondary | ICD-10-CM | POA: Diagnosis not present

## 2015-06-08 DIAGNOSIS — M47816 Spondylosis without myelopathy or radiculopathy, lumbar region: Secondary | ICD-10-CM | POA: Diagnosis not present

## 2015-06-08 DIAGNOSIS — M47812 Spondylosis without myelopathy or radiculopathy, cervical region: Secondary | ICD-10-CM | POA: Diagnosis not present

## 2015-06-15 DIAGNOSIS — G5622 Lesion of ulnar nerve, left upper limb: Secondary | ICD-10-CM | POA: Diagnosis not present

## 2015-06-23 DIAGNOSIS — M25519 Pain in unspecified shoulder: Secondary | ICD-10-CM | POA: Diagnosis not present

## 2015-06-23 DIAGNOSIS — M2550 Pain in unspecified joint: Secondary | ICD-10-CM | POA: Diagnosis not present

## 2015-06-23 DIAGNOSIS — M791 Myalgia: Secondary | ICD-10-CM | POA: Diagnosis not present

## 2015-06-23 DIAGNOSIS — M25569 Pain in unspecified knee: Secondary | ICD-10-CM | POA: Diagnosis not present

## 2015-07-05 DIAGNOSIS — M159 Polyosteoarthritis, unspecified: Secondary | ICD-10-CM | POA: Diagnosis not present

## 2015-07-05 DIAGNOSIS — M47816 Spondylosis without myelopathy or radiculopathy, lumbar region: Secondary | ICD-10-CM | POA: Diagnosis not present

## 2015-07-05 DIAGNOSIS — M47812 Spondylosis without myelopathy or radiculopathy, cervical region: Secondary | ICD-10-CM | POA: Diagnosis not present

## 2015-07-05 DIAGNOSIS — M791 Myalgia: Secondary | ICD-10-CM | POA: Diagnosis not present

## 2015-07-05 DIAGNOSIS — E119 Type 2 diabetes mellitus without complications: Secondary | ICD-10-CM | POA: Diagnosis not present

## 2015-07-05 DIAGNOSIS — G894 Chronic pain syndrome: Secondary | ICD-10-CM | POA: Diagnosis not present

## 2015-07-05 DIAGNOSIS — M1990 Unspecified osteoarthritis, unspecified site: Secondary | ICD-10-CM | POA: Diagnosis not present

## 2015-08-02 DIAGNOSIS — G894 Chronic pain syndrome: Secondary | ICD-10-CM | POA: Diagnosis not present

## 2015-08-02 DIAGNOSIS — M791 Myalgia: Secondary | ICD-10-CM | POA: Diagnosis not present

## 2015-08-02 DIAGNOSIS — M1990 Unspecified osteoarthritis, unspecified site: Secondary | ICD-10-CM | POA: Diagnosis not present

## 2015-08-02 DIAGNOSIS — E119 Type 2 diabetes mellitus without complications: Secondary | ICD-10-CM | POA: Diagnosis not present

## 2015-08-02 DIAGNOSIS — M47816 Spondylosis without myelopathy or radiculopathy, lumbar region: Secondary | ICD-10-CM | POA: Diagnosis not present

## 2015-08-02 DIAGNOSIS — M47812 Spondylosis without myelopathy or radiculopathy, cervical region: Secondary | ICD-10-CM | POA: Diagnosis not present

## 2015-08-02 DIAGNOSIS — M159 Polyosteoarthritis, unspecified: Secondary | ICD-10-CM | POA: Diagnosis not present

## 2015-08-17 DIAGNOSIS — G5622 Lesion of ulnar nerve, left upper limb: Secondary | ICD-10-CM | POA: Diagnosis not present

## 2015-08-25 ENCOUNTER — Encounter: Payer: Self-pay | Admitting: Gastroenterology

## 2015-09-14 ENCOUNTER — Encounter: Payer: Self-pay | Admitting: Gastroenterology

## 2015-09-14 DIAGNOSIS — E1140 Type 2 diabetes mellitus with diabetic neuropathy, unspecified: Secondary | ICD-10-CM | POA: Diagnosis not present

## 2015-09-14 DIAGNOSIS — N5201 Erectile dysfunction due to arterial insufficiency: Secondary | ICD-10-CM | POA: Diagnosis not present

## 2015-09-14 DIAGNOSIS — M47816 Spondylosis without myelopathy or radiculopathy, lumbar region: Secondary | ICD-10-CM | POA: Diagnosis not present

## 2015-09-14 DIAGNOSIS — I10 Essential (primary) hypertension: Secondary | ICD-10-CM | POA: Diagnosis not present

## 2015-09-14 DIAGNOSIS — M109 Gout, unspecified: Secondary | ICD-10-CM | POA: Diagnosis not present

## 2015-09-14 DIAGNOSIS — E7800 Pure hypercholesterolemia, unspecified: Secondary | ICD-10-CM | POA: Diagnosis not present

## 2015-09-14 DIAGNOSIS — Z1159 Encounter for screening for other viral diseases: Secondary | ICD-10-CM | POA: Diagnosis not present

## 2015-09-14 DIAGNOSIS — Z0000 Encounter for general adult medical examination without abnormal findings: Secondary | ICD-10-CM | POA: Diagnosis not present

## 2015-10-29 ENCOUNTER — Encounter (INDEPENDENT_AMBULATORY_CARE_PROVIDER_SITE_OTHER): Payer: Self-pay

## 2015-10-29 ENCOUNTER — Ambulatory Visit (AMBULATORY_SURGERY_CENTER): Payer: Self-pay

## 2015-10-29 VITALS — Ht 68.0 in | Wt 229.4 lb

## 2015-10-29 DIAGNOSIS — Z860100 Personal history of colon polyps, unspecified: Secondary | ICD-10-CM

## 2015-10-29 MED ORDER — SUPREP BOWEL PREP KIT 17.5-3.13-1.6 GM/177ML PO SOLN: 1.0000 | Freq: Once | ORAL | 0 refills | Status: AC

## 2015-11-11 ENCOUNTER — Ambulatory Visit (AMBULATORY_SURGERY_CENTER): Payer: Medicare Other | Admitting: Gastroenterology

## 2015-11-11 ENCOUNTER — Encounter: Payer: Self-pay | Admitting: Gastroenterology

## 2015-11-11 VITALS — BP 106/63 | HR 69 | Temp 96.6°F | Resp 14 | Ht 69.0 in | Wt 229.0 lb

## 2015-11-11 DIAGNOSIS — D123 Benign neoplasm of transverse colon: Secondary | ICD-10-CM

## 2015-11-11 DIAGNOSIS — D125 Benign neoplasm of sigmoid colon: Secondary | ICD-10-CM | POA: Diagnosis not present

## 2015-11-11 DIAGNOSIS — E669 Obesity, unspecified: Secondary | ICD-10-CM | POA: Diagnosis not present

## 2015-11-11 DIAGNOSIS — Z86010 Personal history of colonic polyps: Secondary | ICD-10-CM | POA: Diagnosis not present

## 2015-11-11 DIAGNOSIS — E119 Type 2 diabetes mellitus without complications: Secondary | ICD-10-CM | POA: Diagnosis not present

## 2015-11-11 DIAGNOSIS — I10 Essential (primary) hypertension: Secondary | ICD-10-CM | POA: Diagnosis not present

## 2015-11-11 LAB — GLUCOSE, CAPILLARY
GLUCOSE-CAPILLARY: 109 mg/dL — AB (ref 65–99)
GLUCOSE-CAPILLARY: 117 mg/dL — AB (ref 65–99)

## 2015-11-11 MED ORDER — SODIUM CHLORIDE 0.9 % IV SOLN: 500.0000 mL | INTRAVENOUS | Status: DC

## 2015-11-11 NOTE — Progress Notes (Signed)
Called to room to assist during endoscopic procedure.  Patient ID and intended procedure confirmed with present staff. Received instructions for my participation in the procedure from the performing physician.  

## 2015-11-11 NOTE — Patient Instructions (Signed)
YOU HAD AN ENDOSCOPIC PROCEDURE TODAY AT [facility]:   Refer to the procedure report that was given to you for any specific questions about what was found during the examination.  If the procedure report does not answer your questions, please call your gastroenterologist to clarify.  If you requested that your care partner not be given the details of your procedure findings, then the procedure report has been included in a sealed envelope for you to review at your convenience later.  YOU SHOULD EXPECT: Some feelings of bloating in the abdomen. Passage of more gas than usual.  Walking can help get rid of the air that was put into your GI tract during the procedure and reduce the bloating. If you had a lower endoscopy (such as a colonoscopy or flexible sigmoidoscopy) you may notice spotting of blood in your stool or on the toilet paper. If you underwent a bowel prep for your procedure, you may not have a normal bowel movement for a few days.  Please Note:  You might notice some irritation and congestion in your nose or some drainage.  This is from the oxygen used during your procedure.  There is no need for concern and it should clear up in a day or so.  SYMPTOMS TO REPORT IMMEDIATELY:   Following lower endoscopy (colonoscopy or flexible sigmoidoscopy):  Excessive amounts of blood in the stool  Significant tenderness or worsening of abdominal pains  Swelling of the abdomen that is new, acute  Fever of 100F or higher  For urgent or emergent issues, a gastroenterologist can be reached at any hour by calling [phone].   DIET:  We do recommend a small meal at first, but then you may proceed to your regular diet.  Drink plenty of fluids but you should avoid alcoholic beverages for 24 hours.  ACTIVITY:  You should plan to take it easy for the rest of today and you should NOT DRIVE or use heavy machinery until tomorrow (because of the sedation medicines used during the test).     FOLLOW UP: Our staff will call the number listed on your records the next business day following your procedure to check on you and address any questions or concerns that you may have regarding the information given to you following your procedure. If we do not reach you, we will leave a message.  However, if you are feeling well and you are not experiencing any problems, there is no need to return our call.  We will assume that you have returned to your regular daily activities without incident.  If any biopsies were taken you will be contacted by phone or by letter within the next 1-3 weeks.  Please call us at [phone] if you have not heard about the biopsies in 3 weeks.   SIGNATURES/CONFIDENTIALITY: You and/or your care partner have signed paperwork which will be entered into your electronic medical record.  These signatures attest to the fact that that the information above on your After Visit Summary has been reviewed and is understood.  Full responsibility of the confidentiality of this discharge information lies with you and/or your care-partner.  Await pathology for next colonoscopy recall  Please read over handouts about polyps, diverticulosis and high fiber diets  Please continue your normal medications

## 2015-11-11 NOTE — Op Note (Addendum)
[facility] Patient Name: Kwasi Leeds Procedure Date: 11/11/2015 8:29 AM MRN: [id number] Endoscopist: Ladene Artist , MD Age: 65 Referring MD:  Date of Birth: [birth year] Gender: Male Account #: [id number] Procedure:                Colonoscopy Indications:              Surveillance: Personal history of adenomatous                            colonic polyps on last colonoscopy more than 3                            years ago Medicines:                Monitored Anesthesia Care Procedure:                Pre-Anesthesia Assessment:                           - Prior to the procedure, a History and Physical                            was performed, and patient medications and                            allergies were reviewed. The patient's tolerance of                            previous anesthesia was also reviewed. The risks                            and benefits of the procedure and the sedation                            options and risks were discussed with the patient.                            All questions were answered, and informed consent                            was obtained. Prior Anticoagulants: The patient has                            taken no previous anticoagulant or antiplatelet                            agents. ASA Grade Assessment: II - A patient with                            mild systemic disease. After reviewing the risks                            and benefits, the patient was deemed in  satisfactory condition to undergo the procedure.                           After obtaining informed consent, the colonoscope                            was passed under direct vision. Throughout the                            procedure, the patient's blood pressure, pulse, and                            oxygen saturations were monitored continuously. The                            Model PCF-[id number]) scope was introduced                             through the anus and advanced to the the cecum,                            identified by appendiceal orifice and ileocecal                            valve. The ileocecal valve, appendiceal orifice,                            and rectum were photographed. The quality of the                            bowel preparation was excellent. The colonoscopy                            was performed without difficulty. The patient                            tolerated the procedure well. Scope In: 8:48:58 AM Scope Out: 9:04:37 AM Scope Withdrawal Time: 0 hours 12 minutes 10 seconds  Total Procedure Duration: 0 hours 15 minutes 39 seconds  Findings:                 A 3 mm polyp was found in the sigmoid colon. The                            polyp was sessile. The polyp was removed with a                            cold biopsy forceps. Resection and retrieval were                            complete.                           Three sessile polyps were found in the transverse  colon. The polyps were 6 to 7 mm in size. These                            polyps were removed with a cold snare. Resection                            and retrieval were complete.                           Two semi-pedunculated polyps were found in the                            sigmoid colon. The polyps were 7 to 8 mm in size.                            These polyps were removed with a cold snare.                            Resection and retrieval were complete.                           A few small-mouthed diverticula were found in the                            transverse colon and ascending colon. There was no                            evidence of diverticular bleeding.                           The exam was otherwise without abnormality on                            direct and retroflexion views. Complications:            No immediate complications. Estimated blood loss:                             None. Estimated Blood Loss:     Estimated blood loss: none. Impression:               - One 3 mm polyp in the sigmoid colon, removed with                            a cold biopsy forceps. Resected and retrieved.                           - Three 6 to 7 mm polyps in the transverse colon,                            removed with a cold snare. Resected and retrieved.                           - Two 7 to 8  mm polyps in the sigmoid colon,                            removed with a cold snare. Resected and retrieved.                           - Mild diverticulosis in the transverse colon and                            in the ascending colon.                           - The examination was otherwise normal on direct                            and retroflexion views. Recommendation:           - Repeat colonoscopy in 3 years for surveillance if                            3 or more precancerous, otherwise 5 years.                           - Patient has a contact number available for                            emergencies. The signs and symptoms of potential                            delayed complications were discussed with the                            patient. Return to normal activities tomorrow.                            Written discharge instructions were provided to the                            patient.                           - Resume previous diet.                           - Continue present medications.                           - Await pathology results. Ladene Artist, MD 11/11/2015 9:09:49 AM This report has been signed electronically.

## 2015-11-11 NOTE — Progress Notes (Signed)
To recovery, report to [place], RN, VSS 

## 2015-11-12 ENCOUNTER — Telehealth: Payer: Self-pay

## 2015-11-12 NOTE — Telephone Encounter (Signed)
  Follow up Call-  Call back number 11/11/2015  Post procedure Call Back phone  # [phone] hm  Permission to leave phone message Yes  Some recent data might be hidden     Patient questions:  Do you have a fever, pain , or abdominal swelling? No. Pain Score  0 *  Have you tolerated food without any problems? Yes.    Have you been able to return to your normal activities? Yes.    Do you have any questions about your discharge instructions: Diet   No. Medications  No. Follow up visit  No.  Do you have questions or concerns about your Care? No.  Actions: * If pain score is 4 or above: No action needed, pain <4.  No problems per the pt. maw

## 2015-11-18 ENCOUNTER — Encounter: Payer: Self-pay | Admitting: Gastroenterology

## 2016-02-23 DIAGNOSIS — M25519 Pain in unspecified shoulder: Secondary | ICD-10-CM | POA: Diagnosis not present

## 2016-02-23 DIAGNOSIS — M791 Myalgia: Secondary | ICD-10-CM | POA: Diagnosis not present

## 2016-02-23 DIAGNOSIS — M2550 Pain in unspecified joint: Secondary | ICD-10-CM | POA: Diagnosis not present

## 2016-02-23 DIAGNOSIS — M25569 Pain in unspecified knee: Secondary | ICD-10-CM | POA: Diagnosis not present

## 2016-03-03 DIAGNOSIS — N5201 Erectile dysfunction due to arterial insufficiency: Secondary | ICD-10-CM | POA: Diagnosis not present

## 2016-03-03 DIAGNOSIS — Z7984 Long term (current) use of oral hypoglycemic drugs: Secondary | ICD-10-CM | POA: Diagnosis not present

## 2016-03-03 DIAGNOSIS — E7800 Pure hypercholesterolemia, unspecified: Secondary | ICD-10-CM | POA: Diagnosis not present

## 2016-03-03 DIAGNOSIS — E1140 Type 2 diabetes mellitus with diabetic neuropathy, unspecified: Secondary | ICD-10-CM | POA: Diagnosis not present

## 2016-03-03 DIAGNOSIS — I10 Essential (primary) hypertension: Secondary | ICD-10-CM | POA: Diagnosis not present

## 2016-03-03 DIAGNOSIS — M1990 Unspecified osteoarthritis, unspecified site: Secondary | ICD-10-CM | POA: Diagnosis not present

## 2016-05-23 DIAGNOSIS — M25569 Pain in unspecified knee: Secondary | ICD-10-CM | POA: Diagnosis not present

## 2016-05-23 DIAGNOSIS — M25519 Pain in unspecified shoulder: Secondary | ICD-10-CM | POA: Diagnosis not present

## 2016-05-23 DIAGNOSIS — M2550 Pain in unspecified joint: Secondary | ICD-10-CM | POA: Diagnosis not present

## 2016-05-23 DIAGNOSIS — M791 Myalgia: Secondary | ICD-10-CM | POA: Diagnosis not present

## 2016-08-23 DIAGNOSIS — L0390 Cellulitis, unspecified: Secondary | ICD-10-CM | POA: Diagnosis not present

## 2016-08-23 DIAGNOSIS — T63441A Toxic effect of venom of bees, accidental (unintentional), initial encounter: Secondary | ICD-10-CM | POA: Diagnosis not present

## 2016-09-18 DIAGNOSIS — Z0000 Encounter for general adult medical examination without abnormal findings: Secondary | ICD-10-CM | POA: Diagnosis not present

## 2016-09-18 DIAGNOSIS — E7800 Pure hypercholesterolemia, unspecified: Secondary | ICD-10-CM | POA: Diagnosis not present

## 2016-09-18 DIAGNOSIS — Z6835 Body mass index (BMI) 35.0-35.9, adult: Secondary | ICD-10-CM | POA: Diagnosis not present

## 2016-09-18 DIAGNOSIS — E6609 Other obesity due to excess calories: Secondary | ICD-10-CM | POA: Diagnosis not present

## 2016-09-18 DIAGNOSIS — Z125 Encounter for screening for malignant neoplasm of prostate: Secondary | ICD-10-CM | POA: Diagnosis not present

## 2016-09-18 DIAGNOSIS — M1990 Unspecified osteoarthritis, unspecified site: Secondary | ICD-10-CM | POA: Diagnosis not present

## 2016-09-18 DIAGNOSIS — I10 Essential (primary) hypertension: Secondary | ICD-10-CM | POA: Diagnosis not present

## 2016-09-18 DIAGNOSIS — M47816 Spondylosis without myelopathy or radiculopathy, lumbar region: Secondary | ICD-10-CM | POA: Diagnosis not present

## 2016-09-18 DIAGNOSIS — N5201 Erectile dysfunction due to arterial insufficiency: Secondary | ICD-10-CM | POA: Diagnosis not present

## 2016-09-18 DIAGNOSIS — M109 Gout, unspecified: Secondary | ICD-10-CM | POA: Diagnosis not present

## 2016-09-18 DIAGNOSIS — E1140 Type 2 diabetes mellitus with diabetic neuropathy, unspecified: Secondary | ICD-10-CM | POA: Diagnosis not present

## 2016-09-18 DIAGNOSIS — G5701 Lesion of sciatic nerve, right lower limb: Secondary | ICD-10-CM | POA: Diagnosis not present

## 2016-09-30 DIAGNOSIS — M5430 Sciatica, unspecified side: Secondary | ICD-10-CM | POA: Diagnosis not present

## 2016-10-08 DIAGNOSIS — J069 Acute upper respiratory infection, unspecified: Secondary | ICD-10-CM | POA: Diagnosis not present

## 2016-10-08 DIAGNOSIS — J209 Acute bronchitis, unspecified: Secondary | ICD-10-CM | POA: Diagnosis not present

## 2016-10-11 DIAGNOSIS — M5441 Lumbago with sciatica, right side: Secondary | ICD-10-CM | POA: Diagnosis not present

## 2016-11-15 DIAGNOSIS — M5441 Lumbago with sciatica, right side: Secondary | ICD-10-CM | POA: Diagnosis not present

## 2017-01-04 DIAGNOSIS — N3091 Cystitis, unspecified with hematuria: Secondary | ICD-10-CM | POA: Diagnosis not present

## 2017-01-04 DIAGNOSIS — M545 Low back pain: Secondary | ICD-10-CM | POA: Diagnosis not present

## 2017-01-31 DIAGNOSIS — M5441 Lumbago with sciatica, right side: Secondary | ICD-10-CM | POA: Diagnosis not present

## 2017-02-08 DIAGNOSIS — M545 Low back pain: Secondary | ICD-10-CM | POA: Diagnosis not present

## 2017-02-23 DIAGNOSIS — M5441 Lumbago with sciatica, right side: Secondary | ICD-10-CM | POA: Diagnosis not present

## 2017-02-26 DIAGNOSIS — M48062 Spinal stenosis, lumbar region with neurogenic claudication: Secondary | ICD-10-CM | POA: Diagnosis not present

## 2017-02-27 DIAGNOSIS — M48062 Spinal stenosis, lumbar region with neurogenic claudication: Secondary | ICD-10-CM | POA: Diagnosis not present

## 2017-03-06 DIAGNOSIS — M48062 Spinal stenosis, lumbar region with neurogenic claudication: Secondary | ICD-10-CM | POA: Diagnosis not present

## 2017-03-23 DIAGNOSIS — M48062 Spinal stenosis, lumbar region with neurogenic claudication: Secondary | ICD-10-CM | POA: Diagnosis not present

## 2017-04-03 DIAGNOSIS — M48062 Spinal stenosis, lumbar region with neurogenic claudication: Secondary | ICD-10-CM | POA: Diagnosis not present

## 2017-04-24 DIAGNOSIS — M48062 Spinal stenosis, lumbar region with neurogenic claudication: Secondary | ICD-10-CM | POA: Diagnosis not present

## 2017-04-26 DIAGNOSIS — M5416 Radiculopathy, lumbar region: Secondary | ICD-10-CM | POA: Diagnosis not present

## 2017-05-08 DIAGNOSIS — E1140 Type 2 diabetes mellitus with diabetic neuropathy, unspecified: Secondary | ICD-10-CM | POA: Diagnosis not present

## 2017-05-08 DIAGNOSIS — M47816 Spondylosis without myelopathy or radiculopathy, lumbar region: Secondary | ICD-10-CM | POA: Diagnosis not present

## 2017-05-08 DIAGNOSIS — E7800 Pure hypercholesterolemia, unspecified: Secondary | ICD-10-CM | POA: Diagnosis not present

## 2017-05-08 DIAGNOSIS — I10 Essential (primary) hypertension: Secondary | ICD-10-CM | POA: Diagnosis not present

## 2017-05-16 ENCOUNTER — Other Ambulatory Visit: Payer: Self-pay | Admitting: Orthopedic Surgery

## 2017-05-16 DIAGNOSIS — M47812 Spondylosis without myelopathy or radiculopathy, cervical region: Secondary | ICD-10-CM | POA: Diagnosis not present

## 2017-05-16 DIAGNOSIS — M47816 Spondylosis without myelopathy or radiculopathy, lumbar region: Secondary | ICD-10-CM | POA: Diagnosis not present

## 2017-05-16 DIAGNOSIS — M159 Polyosteoarthritis, unspecified: Secondary | ICD-10-CM | POA: Diagnosis not present

## 2017-05-16 DIAGNOSIS — M48061 Spinal stenosis, lumbar region without neurogenic claudication: Secondary | ICD-10-CM | POA: Diagnosis not present

## 2017-05-16 DIAGNOSIS — E119 Type 2 diabetes mellitus without complications: Secondary | ICD-10-CM | POA: Diagnosis not present

## 2017-05-16 DIAGNOSIS — G894 Chronic pain syndrome: Secondary | ICD-10-CM | POA: Diagnosis not present

## 2017-05-16 DIAGNOSIS — M5136 Other intervertebral disc degeneration, lumbar region: Secondary | ICD-10-CM | POA: Diagnosis not present

## 2017-05-16 DIAGNOSIS — M1990 Unspecified osteoarthritis, unspecified site: Secondary | ICD-10-CM | POA: Diagnosis not present

## 2017-05-16 DIAGNOSIS — Z79891 Long term (current) use of opiate analgesic: Secondary | ICD-10-CM | POA: Diagnosis not present

## 2017-05-16 DIAGNOSIS — M9983 Other biomechanical lesions of lumbar region: Secondary | ICD-10-CM | POA: Diagnosis not present

## 2017-05-16 NOTE — Pre-Procedure Instructions (Signed)
Jeff Cunningham  05/16/2017      [facility] [address] [place] [address] [place] [address] Phone: [phone] Fax: [phone]    Your procedure is scheduled on   Wednesday 05/23/17  Report to [facility] at 1040 A.M.  Call this number if you have problems the morning of surgery:  [phone]   Remember:  Do not eat food or drink liquids after midnight.  Take these medicines the morning of surgery with A SIP OF WATER  Allopurinol, amlodipine (norvasc), oxycodone if needed, ranitidine (zantac)  7 days prior to surgery STOP taking any Aspirin(unless otherwise instructed by your surgeon), Aleve, Naproxen, Ibuprofen, Motrin, Advil, Goody's, BC's, all herbal medications, fish oil, and all vitamins     How to Manage Your Diabetes Before and After Surgery  Why is it important to control my blood sugar before and after surgery? . Improving blood sugar levels before and after surgery helps healing and can limit problems. . A way of improving blood sugar control is eating a healthy diet by: o  Eating less sugar and carbohydrates o  Increasing activity/exercise o  Talking with your doctor about reaching your blood sugar goals . High blood sugars (greater than 180 mg/dL) can raise your risk of infections and slow your recovery, so you will need to focus on controlling your diabetes during the weeks before surgery. . Make sure that the doctor who takes care of your diabetes knows about your planned surgery including the date and location.  How do I manage my blood sugar before surgery? . Check your blood sugar at least 4 times a day, starting 2 days before surgery, to make sure that the level is not too high or low. o Check your blood sugar the morning of your surgery when you wake up and every 2 hours until you get to the Short Stay unit. . If your blood sugar is less than 70 mg/dL, you will need to treat for low blood  sugar: o Do not take insulin. o Treat a low blood sugar (less than 70 mg/dL) with  cup of clear juice (cranberry or apple), 4 glucose tablets, OR glucose gel. Recheck blood sugar in 15 minutes after treatment (to make sure it is greater than 70 mg/dL). If your blood sugar is not greater than 70 mg/dL on recheck, call [phone] o  for further instructions. . Report your blood sugar to the short stay nurse when you get to Short Stay.  . If you are admitted to the hospital after surgery: o Your blood sugar will be checked by the staff and you will probably be given insulin after surgery (instead of oral diabetes medicines) to make sure you have good blood sugar levels. o The goal for blood sugar control after surgery is 80-180 mg/dL.              WHAT DO I DO ABOUT MY DIABETES MEDICATION?   Marland Kitchen Do not take oral diabetes medicines (pills) the morning of surgery.  . THE NIGHT BEFORE SURGERY, take ___________ units of ___________insulin.       . THE MORNING OF SURGERY, take _____________ units of __________insulin.  . The day of surgery, do not take other diabetes injectables, including Byetta (exenatide), Bydureon (exenatide ER), Victoza (liraglutide), or Trulicity (dulaglutide).  . If your CBG is greater than 220 mg/dL, you may take  of your sliding scale (correction) dose of insulin.  Other Instructions:          Patient Signature:  Date:   Nurse Signature:  Date:   Reviewed and Endorsed by [facility] Patient Education Committee, August 2015   Do not wear jewelry, make-up or nail polish.  Do not wear lotions, powders, or perfumes, or deodorant.  Do not shave 48 hours prior to surgery.  Men may shave face and neck.  Do not bring valuables to the hospital.  [facility] is not responsible for any belongings or valuables.  Contacts, dentures or bridgework may not be worn into surgery.  Leave your suitcase in the car.  After surgery it may be brought to your  room.  For patients admitted to the hospital, discharge time will be determined by your treatment team.  Patients discharged the day of surgery will not be allowed to drive home.   Name and phone number of your driver:   Special instructions:  [facility] - Preparing for Surgery  Before surgery, you can play an important role.  Because skin is not sterile, your skin needs to be as free of germs as possible.  You can reduce the number of germs on you skin by washing with CHG (chlorahexidine gluconate) soap before surgery.  CHG is an antiseptic cleaner which kills germs and bonds with the skin to continue killing germs even after washing.  Please DO NOT use if you have an allergy to CHG or antibacterial soaps.  If your skin becomes reddened/irritated stop using the CHG and inform your nurse when you arrive at Short Stay.  Do not shave (including legs and underarms) for at least 48 hours prior to the first CHG shower.  You may shave your face.  Please follow these instructions carefully:   1.  Shower with CHG Soap the night before surgery and the                                morning of Surgery.  2.  If you choose to wash your hair, wash your hair first as usual with your       normal shampoo.  3.  After you shampoo, rinse your hair and body thoroughly to remove the                      Shampoo.  4.  Use CHG as you would any other liquid soap.  You can apply chg directly       to the skin and wash gently with scrungie or a clean washcloth.  5.  Apply the CHG Soap to your body ONLY FROM THE NECK DOWN.        Do not use on open wounds or open sores.  Avoid contact with your eyes,       ears, mouth and genitals (private parts).  Wash genitals (private parts)       with your normal soap.  6.  Wash thoroughly, paying special attention to the area where your surgery        will be performed.  7.  Thoroughly rinse your body with warm water from the neck down.  8.  DO NOT shower/wash with your normal  soap after using and rinsing off       the CHG Soap.  9.  Pat yourself dry with a clean towel.            10.  Wear clean  pajamas.            11.  Place clean sheets on your bed the night of your first shower and do not        sleep with pets.  Day of Surgery  Do not apply any lotions/deoderants the morning of surgery.  Please wear clean clothes to the hospital/surgery center.     Please read over the following fact sheets that you were given. MRSA Information and Surgical Site Infection Prevention

## 2017-05-16 NOTE — Pre-Procedure Instructions (Signed)
ARGELIO GRANIER  05/16/2017      [facility] [address] [place] [address]  [place] [address] Phone: [phone] Fax: [phone]    Your procedure is scheduled on Wednesday April 3.  Report to [facility] Admitting at 11:40 A.M.  Call this number if you have problems the morning of surgery:  [phone]   Remember:  Do not eat food or drink liquids after midnight.  Take these medicines the morning of surgery with A SIP OF WATER:   Allopurinol (zyloprim) Amlodipine (norvasc) Metoprolol (lopressor) Ranitidine (Zantac) Oxycodone-acetaminophen (Roxicet) if needed  DO NOT TAKE metformin (Glucophage) the day of surgery  7 days prior to surgery STOP taking any Aspirin(unless otherwise instructed by your surgeon), Aleve, Naproxen, Ibuprofen, Motrin, Advil, Goody's, BC's, all herbal medications, fish oil, and all vitamins  **Follow your surgeon's instructions on stopping Aspirin. If no instructions were given, please call your surgeon's office**     How to Manage Your Diabetes Before and After Surgery  Why is it important to control my blood sugar before and after surgery? . Improving blood sugar levels before and after surgery helps healing and can limit problems. . A way of improving blood sugar control is eating a healthy diet by: o  Eating less sugar and carbohydrates o  Increasing activity/exercise o  Talking with your doctor about reaching your blood sugar goals . High blood sugars (greater than 180 mg/dL) can raise your risk of infections and slow your recovery, so you will need to focus on controlling your diabetes during the weeks before surgery. . Make sure that the doctor who takes care of your diabetes knows about your planned surgery including the date and location.  How do I manage my blood sugar before surgery? . Check your blood sugar at least 4 times a day, starting 2 days before surgery, to make sure  that the level is not too high or low. o Check your blood sugar the morning of your surgery when you wake up and every 2 hours until you get to the Short Stay unit. . If your blood sugar is less than 70 mg/dL, you will need to treat for low blood sugar: o Do not take insulin. o Treat a low blood sugar (less than 70 mg/dL) with  cup of clear juice (cranberry or apple), 4 glucose tablets, OR glucose gel. Recheck blood sugar in 15 minutes after treatment (to make sure it is greater than 70 mg/dL). If your blood sugar is not greater than 70 mg/dL on recheck, call [phone] o  for further instructions. . Report your blood sugar to the short stay nurse when you get to Short Stay.  . If you are admitted to the hospital after surgery: o Your blood sugar will be checked by the staff and you will probably be given insulin after surgery (instead of oral diabetes medicines) to make sure you have good blood sugar levels. o The goal for blood sugar control after surgery is 80-180 mg/dL.               Do not wear jewelry, make-up or nail polish.  Do not wear lotions, powders, or perfumes, or deodorant.  Do not shave 48 hours prior to surgery.  Men may shave face and neck.  Do not bring valuables to the hospital.  [facility] is not responsible for any belongings or valuables.  Contacts, dentures or bridgework may not be worn  into surgery.  Leave your suitcase in the car.  After surgery it may be brought to your room.  For patients admitted to the hospital, discharge time will be determined by your treatment team.  Patients discharged the day of surgery will not be allowed to drive home.    Special instructions:    [place]- Preparing For Surgery  Before surgery, you can play an important role. Because skin is not sterile, your skin needs to be as free of germs as possible. You can reduce the number of germs on your skin by washing with CHG (chlorahexidine gluconate) Soap before  surgery.  CHG is an antiseptic cleaner which kills germs and bonds with the skin to continue killing germs even after washing.  Please do not use if you have an allergy to CHG or antibacterial soaps. If your skin becomes reddened/irritated stop using the CHG.  Do not shave (including legs and underarms) for at least 48 hours prior to first CHG shower. It is OK to shave your face.  Please follow these instructions carefully.   1. Shower the NIGHT BEFORE SURGERY and the MORNING OF SURGERY with CHG.   2. If you chose to wash your hair, wash your hair first as usual with your normal shampoo.  3. After you shampoo, rinse your hair and body thoroughly to remove the shampoo.  4. Use CHG as you would any other liquid soap. You can apply CHG directly to the skin and wash gently with a scrungie or a clean washcloth.   5. Apply the CHG Soap to your body ONLY FROM THE NECK DOWN.  Do not use on open wounds or open sores. Avoid contact with your eyes, ears, mouth and genitals (private parts). Wash Face and genitals (private parts)  with your normal soap.  6. Wash thoroughly, paying special attention to the area where your surgery will be performed.  7. Thoroughly rinse your body with warm water from the neck down.  8. DO NOT shower/wash with your normal soap after using and rinsing off the CHG Soap.  9. Pat yourself dry with a CLEAN TOWEL.  10. Wear CLEAN PAJAMAS to bed the night before surgery, wear comfortable clothes the morning of surgery  11. Place CLEAN SHEETS on your bed the night of your first shower and DO NOT SLEEP WITH PETS.    Day of Surgery: Do not apply any deodorants/lotions. Please wear clean clothes to the hospital/surgery center.      Please read over the following fact sheets that you were given. Coughing and Deep Breathing, MRSA Information and Surgical Site Infection Prevention

## 2017-05-17 ENCOUNTER — Encounter (HOSPITAL_COMMUNITY)
Admission: RE | Admit: 2017-05-17 | Discharge: 2017-05-17 | Disposition: A | Discharge status: Home / Self Care | Payer: Medicare Other | Source: Ambulatory Visit | Attending: Orthopedic Surgery | Admitting: Orthopedic Surgery

## 2017-05-17 ENCOUNTER — Encounter (HOSPITAL_COMMUNITY): Payer: Self-pay

## 2017-05-17 ENCOUNTER — Other Ambulatory Visit: Payer: Self-pay

## 2017-05-17 DIAGNOSIS — E119 Type 2 diabetes mellitus without complications: Secondary | ICD-10-CM | POA: Insufficient documentation

## 2017-05-17 DIAGNOSIS — Z01810 Encounter for preprocedural cardiovascular examination: Secondary | ICD-10-CM | POA: Diagnosis not present

## 2017-05-17 DIAGNOSIS — I700 Atherosclerosis of aorta: Secondary | ICD-10-CM | POA: Diagnosis not present

## 2017-05-17 DIAGNOSIS — Z01818 Encounter for other preprocedural examination: Secondary | ICD-10-CM | POA: Insufficient documentation

## 2017-05-17 DIAGNOSIS — Z01812 Encounter for preprocedural laboratory examination: Secondary | ICD-10-CM | POA: Diagnosis not present

## 2017-05-17 LAB — COMPREHENSIVE METABOLIC PANEL
ALT: 22 U/L (ref 17–63)
AST: 27 U/L (ref 15–41)
Albumin: 4.3 g/dL (ref 3.5–5.0)
Alkaline Phosphatase: 66 U/L (ref 38–126)
Anion gap: 9 (ref 5–15)
BUN: 11 mg/dL (ref 6–20)
CHLORIDE: 107 mmol/L (ref 101–111)
CO2: 23 mmol/L (ref 22–32)
Calcium: 9.4 mg/dL (ref 8.9–10.3)
Creatinine, Ser: 0.82 mg/dL (ref 0.61–1.24)
GFR calc non Af Amer: >60 mL/min (ref >60)
Glucose, Bld: 129 mg/dL — ABNORMAL HIGH (ref 65–99)
POTASSIUM: 4 mmol/L (ref 3.5–5.1)
SODIUM: 139 mmol/L (ref 135–145)
Total Bilirubin: 0.7 mg/dL (ref 0.3–1.2)
Total Protein: 7.3 g/dL (ref 6.5–8.1)

## 2017-05-17 LAB — CBC WITH DIFFERENTIAL/PLATELET
BASOS ABS: 0 10*3/uL (ref 0.0–0.1)
Basophils Relative: 1 %
EOS ABS: 0.1 10*3/uL (ref 0.0–0.7)
EOS PCT: 2 %
HCT: 41.6 % (ref 39.0–52.0)
Hemoglobin: 14.2 g/dL (ref 13.0–17.0)
LYMPHS ABS: 2.4 10*3/uL (ref 0.7–4.0)
Lymphocytes Relative: 37 %
MCH: 29.8 pg (ref 26.0–34.0)
MCHC: 34.1 g/dL (ref 30.0–36.0)
MCV: 87.4 fL (ref 78.0–100.0)
Monocytes Absolute: 0.7 10*3/uL (ref 0.1–1.0)
Monocytes Relative: 10 %
Neutro Abs: 3.3 10*3/uL (ref 1.7–7.7)
Neutrophils Relative %: 50 %
PLATELETS: 184 10*3/uL (ref 150–400)
RBC: 4.76 MIL/uL (ref 4.22–5.81)
RDW: 13.9 % (ref 11.5–15.5)
WBC: 6.5 10*3/uL (ref 4.0–10.5)

## 2017-05-17 LAB — URINALYSIS, ROUTINE W REFLEX MICROSCOPIC
BILIRUBIN URINE: NEGATIVE
Glucose, UA: NEGATIVE mg/dL
Hgb urine dipstick: NEGATIVE
KETONES UR: NEGATIVE mg/dL
Leukocytes, UA: NEGATIVE
Nitrite: NEGATIVE
Protein, ur: NEGATIVE mg/dL
Specific Gravity, Urine: 1.018 (ref 1.005–1.030)
pH: 5 (ref 5.0–8.0)

## 2017-05-17 LAB — HEMOGLOBIN A1C
HEMOGLOBIN A1C: 6 % — AB (ref 4.8–5.6)
MEAN PLASMA GLUCOSE: 125.5 mg/dL

## 2017-05-17 LAB — TYPE AND SCREEN
ABO/RH(D): B NEG
Antibody Screen: NEGATIVE

## 2017-05-17 LAB — PROTIME-INR
INR: 0.97
Prothrombin Time: 12.8 seconds (ref 11.4–15.2)

## 2017-05-17 LAB — SURGICAL PCR SCREEN
MRSA, PCR: NEGATIVE
Staphylococcus aureus: NEGATIVE

## 2017-05-17 LAB — ABO/RH: ABO/RH(D): B NEG

## 2017-05-17 LAB — GLUCOSE, CAPILLARY: Glucose-Capillary: 127 mg/dL — ABNORMAL HIGH (ref 65–99)

## 2017-05-17 LAB — APTT: APTT: 27 s (ref 24–36)

## 2017-05-17 NOTE — Progress Notes (Signed)
PCP - Kathyrn Lass Cardiologist - denies any cardiac workup or cardiac history  Chest x-ray - 05/17/2017  EKG - 05/17/2017   Fasting Blood Sugar - pt does not check CBG at home, A1c checked today  Aspirin Instructions: pt was told by MD to stop Aspirin 1 week prior to surgery. Pt will confirm this with Dr. Lynann Bologna this afternoon   Patient denies shortness of breath, fever, cough and chest pain at PAT appointment   Patient verbalized understanding of instructions that were given to them at the PAT appointment. Patient was also instructed that they will need to review over the PAT instructions again at home before surgery.

## 2017-05-22 NOTE — Anesthesia Preprocedure Evaluation (Addendum)
Anesthesia Evaluation  Patient identified by MRN, date of birth, ID band Patient awake    Reviewed: Allergy & Precautions, NPO status , Patient's Chart, lab work & pertinent test results, reviewed documented beta blocker date and time   History of Anesthesia Complications Negative for: history of anesthetic complications  Airway Mallampati: II  TM Distance: >3 FB Neck ROM: Full    Dental  (+) Caps, Dental Advisory Given   Pulmonary neg pulmonary ROS,    breath sounds clear to auscultation       Cardiovascular hypertension, Pt. on medications and Pt. on home beta blockers (-) angina Rhythm:Regular Rate:Normal     Neuro/Psych Depression Chronic back pain: narcotics    GI/Hepatic Neg liver ROS, GERD  Medicated and Controlled,  Endo/Other  diabetes (glu 144), Oral Hypoglycemic AgentsMorbid obesity  Renal/GU negative Renal ROS     Musculoskeletal   Abdominal (+) + obese,   Peds  Hematology negative hematology ROS (+)   Anesthesia Other Findings   Reproductive/Obstetrics                            Anesthesia Physical Anesthesia Plan  ASA: II  Anesthesia Plan: General   Post-op Pain Management:    Induction: Intravenous  PONV Risk Score and Plan: 3 and Ondansetron, Dexamethasone and Diphenhydramine  Airway Management Planned: Oral ETT  Additional Equipment:   Intra-op Plan:   Post-operative Plan: Extubation in OR  Informed Consent: I have reviewed the patients History and Physical, chart, labs and discussed the procedure including the risks, benefits and alternatives for the proposed anesthesia with the patient or authorized representative who has indicated his/her understanding and acceptance.   Dental advisory given  Plan Discussed with: CRNA and Surgeon  Anesthesia Plan Comments: (Plan routine monitors, GETA)        Anesthesia Quick Evaluation

## 2017-05-23 ENCOUNTER — Encounter (HOSPITAL_COMMUNITY)
Admission: RE | Disposition: A | Discharge status: Home / Self Care | Payer: Self-pay | Source: Ambulatory Visit | Attending: Orthopedic Surgery

## 2017-05-23 ENCOUNTER — Inpatient Hospital Stay (HOSPITAL_COMMUNITY): Payer: Medicare Other | Admitting: Anesthesiology

## 2017-05-23 ENCOUNTER — Encounter (HOSPITAL_COMMUNITY): Payer: Self-pay | Admitting: Certified Registered Nurse Anesthetist

## 2017-05-23 ENCOUNTER — Ambulatory Visit (HOSPITAL_COMMUNITY): Payer: Medicare Other

## 2017-05-23 ENCOUNTER — Ambulatory Visit (HOSPITAL_COMMUNITY)
Admission: RE | Admit: 2017-05-23 | Discharge: 2017-05-23 | Disposition: A | Discharge status: Home / Self Care | Payer: Medicare Other | Source: Ambulatory Visit | Attending: Orthopedic Surgery | Admitting: Orthopedic Surgery

## 2017-05-23 ENCOUNTER — Inpatient Hospital Stay (HOSPITAL_COMMUNITY): Payer: Medicare Other

## 2017-05-23 DIAGNOSIS — Z79899 Other long term (current) drug therapy: Secondary | ICD-10-CM | POA: Diagnosis not present

## 2017-05-23 DIAGNOSIS — E119 Type 2 diabetes mellitus without complications: Secondary | ICD-10-CM | POA: Diagnosis not present

## 2017-05-23 DIAGNOSIS — Z981 Arthrodesis status: Secondary | ICD-10-CM | POA: Diagnosis not present

## 2017-05-23 DIAGNOSIS — Z7984 Long term (current) use of oral hypoglycemic drugs: Secondary | ICD-10-CM | POA: Insufficient documentation

## 2017-05-23 DIAGNOSIS — M5117 Intervertebral disc disorders with radiculopathy, lumbosacral region: Secondary | ICD-10-CM | POA: Insufficient documentation

## 2017-05-23 DIAGNOSIS — Z7982 Long term (current) use of aspirin: Secondary | ICD-10-CM | POA: Insufficient documentation

## 2017-05-23 DIAGNOSIS — M109 Gout, unspecified: Secondary | ICD-10-CM | POA: Diagnosis not present

## 2017-05-23 DIAGNOSIS — K219 Gastro-esophageal reflux disease without esophagitis: Secondary | ICD-10-CM | POA: Insufficient documentation

## 2017-05-23 DIAGNOSIS — I10 Essential (primary) hypertension: Secondary | ICD-10-CM | POA: Diagnosis not present

## 2017-05-23 DIAGNOSIS — Z419 Encounter for procedure for purposes other than remedying health state, unspecified: Secondary | ICD-10-CM

## 2017-05-23 DIAGNOSIS — Z6834 Body mass index (BMI) 34.0-34.9, adult: Secondary | ICD-10-CM | POA: Insufficient documentation

## 2017-05-23 DIAGNOSIS — E785 Hyperlipidemia, unspecified: Secondary | ICD-10-CM | POA: Insufficient documentation

## 2017-05-23 DIAGNOSIS — M79605 Pain in left leg: Secondary | ICD-10-CM | POA: Diagnosis not present

## 2017-05-23 DIAGNOSIS — M5417 Radiculopathy, lumbosacral region: Secondary | ICD-10-CM | POA: Diagnosis not present

## 2017-05-23 DIAGNOSIS — M5127 Other intervertebral disc displacement, lumbosacral region: Secondary | ICD-10-CM | POA: Diagnosis not present

## 2017-05-23 HISTORY — PX: LUMBAR LAMINECTOMY/DECOMPRESSION MICRODISCECTOMY: SHX5026

## 2017-05-23 LAB — GLUCOSE, CAPILLARY
GLUCOSE-CAPILLARY: 144 mg/dL — AB (ref 65–99)
Glucose-Capillary: 140 mg/dL — ABNORMAL HIGH (ref 65–99)

## 2017-05-23 SURGERY — LUMBAR LAMINECTOMY/DECOMPRESSION MICRODISCECTOMY
Anesthesia: General | Site: Spine Lumbar | Laterality: Left

## 2017-05-23 MED ORDER — METHYLENE BLUE 0.5 % INJ SOLN
INTRAVENOUS | Status: AC
  Filled 2017-05-23: qty 10

## 2017-05-23 MED ORDER — HEMOSTATIC AGENTS (NO CHARGE) OPTIME
TOPICAL | Status: DC | PRN
  Administered 2017-05-23: 1
  Administered 2017-05-23: 1 via TOPICAL

## 2017-05-23 MED ORDER — LIDOCAINE HCL (CARDIAC) 20 MG/ML IV SOLN
INTRAVENOUS | Status: DC | PRN
  Administered 2017-05-23: 40 mg via INTRAVENOUS

## 2017-05-23 MED ORDER — HYDROMORPHONE HCL 1 MG/ML IJ SOLN
0.2500 mg | INTRAMUSCULAR | Status: DC | PRN
  Administered 2017-05-23: 0.5 mg via INTRAVENOUS

## 2017-05-23 MED ORDER — FENTANYL CITRATE (PF) 250 MCG/5ML IJ SOLN
INTRAMUSCULAR | Status: AC
  Filled 2017-05-23: qty 5

## 2017-05-23 MED ORDER — PROMETHAZINE HCL 25 MG/ML IJ SOLN: 6.2500 mg | INTRAMUSCULAR | Status: DC | PRN

## 2017-05-23 MED ORDER — PHENYLEPHRINE HCL 10 MG/ML IJ SOLN
INTRAMUSCULAR | Status: DC | PRN
  Administered 2017-05-23 (×5): 80 ug via INTRAVENOUS

## 2017-05-23 MED ORDER — MEPERIDINE HCL 50 MG/ML IJ SOLN: 6.2500 mg | INTRAMUSCULAR | Status: DC | PRN

## 2017-05-23 MED ORDER — SUGAMMADEX SODIUM 500 MG/5ML IV SOLN
INTRAVENOUS | Status: AC
  Filled 2017-05-23: qty 5

## 2017-05-23 MED ORDER — LACTATED RINGERS IV SOLN
INTRAVENOUS | Status: DC | PRN
  Administered 2017-05-23 (×2): via INTRAVENOUS

## 2017-05-23 MED ORDER — BUPIVACAINE-EPINEPHRINE 0.25% -1:200000 IJ SOLN
INTRAMUSCULAR | Status: DC | PRN
  Administered 2017-05-23: 30 mL

## 2017-05-23 MED ORDER — DEXAMETHASONE SODIUM PHOSPHATE 10 MG/ML IJ SOLN
INTRAMUSCULAR | Status: AC
  Filled 2017-05-23: qty 1

## 2017-05-23 MED ORDER — BUPIVACAINE-EPINEPHRINE (PF) 0.25% -1:200000 IJ SOLN
INTRAMUSCULAR | Status: AC
  Filled 2017-05-23: qty 30

## 2017-05-23 MED ORDER — PROPOFOL 10 MG/ML IV BOLUS
INTRAVENOUS | Status: DC | PRN
  Administered 2017-05-23: 120 mg via INTRAVENOUS

## 2017-05-23 MED ORDER — DEXAMETHASONE SODIUM PHOSPHATE 4 MG/ML IJ SOLN
INTRAMUSCULAR | Status: DC | PRN
  Administered 2017-05-23: 10 mg via INTRAVENOUS

## 2017-05-23 MED ORDER — ROCURONIUM BROMIDE 100 MG/10ML IV SOLN
INTRAVENOUS | Status: DC | PRN
  Administered 2017-05-23: 30 mg via INTRAVENOUS
  Administered 2017-05-23: 50 mg via INTRAVENOUS

## 2017-05-23 MED ORDER — METHYLPREDNISOLONE ACETATE 40 MG/ML IJ SUSP
INTRAMUSCULAR | Status: AC
  Filled 2017-05-23: qty 1

## 2017-05-23 MED ORDER — BUPIVACAINE LIPOSOME 1.3 % IJ SUSP
20.0000 mL | INTRAMUSCULAR | Status: AC
  Administered 2017-05-23: 20 mL
  Filled 2017-05-23: qty 20

## 2017-05-23 MED ORDER — PHENYLEPHRINE 40 MCG/ML (10ML) SYRINGE FOR IV PUSH (FOR BLOOD PRESSURE SUPPORT)
PREFILLED_SYRINGE | INTRAVENOUS | Status: AC
  Filled 2017-05-23: qty 10

## 2017-05-23 MED ORDER — THROMBIN (RECOMBINANT) 20000 UNITS EX SOLR
CUTANEOUS | Status: AC
  Filled 2017-05-23: qty 20000

## 2017-05-23 MED ORDER — ONDANSETRON HCL 4 MG/2ML IJ SOLN
INTRAMUSCULAR | Status: DC | PRN
  Administered 2017-05-23: 4 mg via INTRAVENOUS

## 2017-05-23 MED ORDER — PROPOFOL 10 MG/ML IV BOLUS
INTRAVENOUS | Status: AC
  Filled 2017-05-23: qty 20

## 2017-05-23 MED ORDER — METHYLENE BLUE 0.5 % INJ SOLN
INTRAVENOUS | Status: DC | PRN
  Administered 2017-05-23: 5 mL

## 2017-05-23 MED ORDER — SUGAMMADEX SODIUM 200 MG/2ML IV SOLN
INTRAVENOUS | Status: DC | PRN
  Administered 2017-05-23: 300 mg via INTRAVENOUS

## 2017-05-23 MED ORDER — CEFAZOLIN SODIUM-DEXTROSE 2-4 GM/100ML-% IV SOLN
2.0000 g | INTRAVENOUS | Status: AC
  Administered 2017-05-23: 2 g via INTRAVENOUS

## 2017-05-23 MED ORDER — METHYLPREDNISOLONE ACETATE 40 MG/ML IJ SUSP
INTRAMUSCULAR | Status: DC | PRN
  Administered 2017-05-23: 40 mg

## 2017-05-23 MED ORDER — MIDAZOLAM HCL 2 MG/2ML IJ SOLN
INTRAMUSCULAR | Status: AC
  Filled 2017-05-23: qty 2

## 2017-05-23 MED ORDER — ROCURONIUM BROMIDE 10 MG/ML (PF) SYRINGE
PREFILLED_SYRINGE | INTRAVENOUS | Status: AC
  Filled 2017-05-23: qty 5

## 2017-05-23 MED ORDER — PHENYLEPHRINE HCL 10 MG/ML IJ SOLN
INTRAVENOUS | Status: DC | PRN
  Administered 2017-05-23: 40 ug/min via INTRAVENOUS

## 2017-05-23 MED ORDER — HYDROMORPHONE HCL 1 MG/ML IJ SOLN
INTRAMUSCULAR | Status: AC
  Filled 2017-05-23: qty 1

## 2017-05-23 MED ORDER — CHLORHEXIDINE GLUCONATE 4 % EX LIQD: 60.0000 mL | Freq: Once | CUTANEOUS | Status: DC

## 2017-05-23 MED ORDER — MIDAZOLAM HCL 2 MG/2ML IJ SOLN: 0.5000 mg | Freq: Once | INTRAMUSCULAR | Status: DC | PRN

## 2017-05-23 MED ORDER — CEFAZOLIN SODIUM-DEXTROSE 2-4 GM/100ML-% IV SOLN
INTRAVENOUS | Status: AC
  Filled 2017-05-23: qty 100

## 2017-05-23 MED ORDER — LIDOCAINE HCL (CARDIAC) 20 MG/ML IV SOLN
INTRAVENOUS | Status: AC
  Filled 2017-05-23: qty 5

## 2017-05-23 MED ORDER — ONDANSETRON HCL 4 MG/2ML IJ SOLN
INTRAMUSCULAR | Status: AC
  Filled 2017-05-23: qty 2

## 2017-05-23 MED ORDER — THROMBIN 20000 UNITS EX SOLR
CUTANEOUS | Status: DC | PRN
  Administered 2017-05-23: 20000 [IU] via TOPICAL

## 2017-05-23 MED ORDER — FENTANYL CITRATE (PF) 100 MCG/2ML IJ SOLN
INTRAMUSCULAR | Status: DC | PRN
  Administered 2017-05-23: 250 ug via INTRAVENOUS

## 2017-05-23 MED ORDER — MIDAZOLAM HCL 5 MG/5ML IJ SOLN
INTRAMUSCULAR | Status: DC | PRN
  Administered 2017-05-23: 2 mg via INTRAVENOUS

## 2017-05-23 MED ORDER — 0.9 % SODIUM CHLORIDE (POUR BTL) OPTIME
TOPICAL | Status: DC | PRN
  Administered 2017-05-23: 1000 mL

## 2017-05-23 SURGICAL SUPPLY — 67 items
APL SKNCLS STERI-STRIP NONHPOA (GAUZE/BANDAGES/DRESSINGS) ×1
BENZOIN TINCTURE PRP APPL 2/3 (GAUZE/BANDAGES/DRESSINGS) ×2 IMPLANT
BUR ROUND PRECISION 4.0 (BURR) ×2 IMPLANT
BUR ROUND PRECISION 4.0MM (BURR) ×1
CANISTER SUCT 3000ML PPV (MISCELLANEOUS) ×3 IMPLANT
CARTRIDGE OIL MAESTRO DRILL (MISCELLANEOUS) ×1 IMPLANT
CHLORAPREP W/TINT 26ML (MISCELLANEOUS) ×2 IMPLANT
CLOSURE WOUND 1/2 X4 (GAUZE/BANDAGES/DRESSINGS) ×1
CORDS BIPOLAR (ELECTRODE) ×3 IMPLANT
COVER SURGICAL LIGHT HANDLE (MISCELLANEOUS) ×3 IMPLANT
DIFFUSER DRILL AIR PNEUMATIC (MISCELLANEOUS) ×3 IMPLANT
DRAPE SURG 17X23 STRL (DRAPES) ×12 IMPLANT
ELECT BLADE 4.0 EZ CLEAN MEGAD (MISCELLANEOUS) ×3
ELECT CAUTERY BLADE 6.4 (BLADE) ×3 IMPLANT
ELECT REM PT RETURN 9FT ADLT (ELECTROSURGICAL) ×3
ELECTRODE BLDE 4.0 EZ CLN MEGD (MISCELLANEOUS) ×1 IMPLANT
ELECTRODE REM PT RTRN 9FT ADLT (ELECTROSURGICAL) ×1 IMPLANT
FILTER STRAW FLUID ASPIR (MISCELLANEOUS) ×3 IMPLANT
FLOSEAL 10ML (HEMOSTASIS) ×2 IMPLANT
GAUZE SPONGE 4X4 12PLY STRL (GAUZE/BANDAGES/DRESSINGS) ×3 IMPLANT
GAUZE SPONGE 4X4 16PLY XRAY LF (GAUZE/BANDAGES/DRESSINGS) ×6 IMPLANT
GLOVE BIO SURGEON STRL SZ7 (GLOVE) ×3 IMPLANT
GLOVE BIO SURGEON STRL SZ8 (GLOVE) ×3 IMPLANT
GLOVE BIOGEL PI IND STRL 7.0 (GLOVE) ×1 IMPLANT
GLOVE BIOGEL PI IND STRL 8 (GLOVE) ×1 IMPLANT
GLOVE BIOGEL PI INDICATOR 7.0 (GLOVE) ×2
GLOVE BIOGEL PI INDICATOR 8 (GLOVE) ×2
GOWN STRL REUS W/ TWL LRG LVL3 (GOWN DISPOSABLE) ×1 IMPLANT
GOWN STRL REUS W/ TWL XL LVL3 (GOWN DISPOSABLE) ×2 IMPLANT
GOWN STRL REUS W/TWL LRG LVL3 (GOWN DISPOSABLE) ×6
GOWN STRL REUS W/TWL XL LVL3 (GOWN DISPOSABLE) ×3
IV CATH 14GX2 1/4 (CATHETERS) ×3 IMPLANT
KIT BASIN OR (CUSTOM PROCEDURE TRAY) ×5 IMPLANT
KIT POSITION SURG JACKSON T1 (MISCELLANEOUS) ×3 IMPLANT
KIT TURNOVER KIT B (KITS) ×3 IMPLANT
NDL 18GX1X1/2 (RX/OR ONLY) (NEEDLE) ×1 IMPLANT
NDL HYPO 25GX1X1/2 BEV (NEEDLE) ×1 IMPLANT
NDL SPNL 18GX3.5 QUINCKE PK (NEEDLE) ×2 IMPLANT
NEEDLE 18GX1X1/2 (RX/OR ONLY) (NEEDLE) ×3 IMPLANT
NEEDLE 22X1 1/2 (OR ONLY) (NEEDLE) ×3 IMPLANT
NEEDLE HYPO 25GX1X1/2 BEV (NEEDLE) ×3 IMPLANT
NEEDLE SPNL 18GX3.5 QUINCKE PK (NEEDLE) ×6 IMPLANT
NS IRRIG 1000ML POUR BTL (IV SOLUTION) ×3 IMPLANT
OIL CARTRIDGE MAESTRO DRILL (MISCELLANEOUS) ×3
PACK LAMINECTOMY ORTHO (CUSTOM PROCEDURE TRAY) ×3 IMPLANT
PACK UNIVERSAL I (CUSTOM PROCEDURE TRAY) ×3 IMPLANT
PAD ARMBOARD 7.5X6 YLW CONV (MISCELLANEOUS) ×6 IMPLANT
PATTIES SURGICAL .5 X.5 (GAUZE/BANDAGES/DRESSINGS) IMPLANT
PATTIES SURGICAL .5 X1 (DISPOSABLE) ×3 IMPLANT
SPONGE INTESTINAL PEANUT (DISPOSABLE) ×3 IMPLANT
SPONGE SURGIFOAM ABS GEL 100 (HEMOSTASIS) ×3 IMPLANT
SPONGE SURGIFOAM ABS GEL SZ50 (HEMOSTASIS) ×3 IMPLANT
STRIP CLOSURE SKIN 1/2X4 (GAUZE/BANDAGES/DRESSINGS) ×1 IMPLANT
SURGIFLO W/THROMBIN 8M KIT (HEMOSTASIS) IMPLANT
SUT MNCRL AB 4-0 PS2 18 (SUTURE) ×3 IMPLANT
SUT VIC AB 0 CT1 18XCR BRD 8 (SUTURE) IMPLANT
SUT VIC AB 0 CT1 8-18 (SUTURE) ×3
SUT VIC AB 2-0 CT2 18 VCP726D (SUTURE) ×3 IMPLANT
SYR 20CC LL (SYRINGE) ×5 IMPLANT
SYR BULB IRRIGATION 50ML (SYRINGE) ×3 IMPLANT
SYR CONTROL 10ML LL (SYRINGE) ×6 IMPLANT
SYR TB 1ML 26GX3/8 SAFETY (SYRINGE) ×6 IMPLANT
SYR TB 1ML LUER SLIP (SYRINGE) ×6 IMPLANT
TAPE CLOTH SURG 4X10 WHT LF (GAUZE/BANDAGES/DRESSINGS) ×2 IMPLANT
TOWEL OR 17X26 10 PK STRL BLUE (TOWEL DISPOSABLE) ×3 IMPLANT
WATER STERILE IRR 1000ML POUR (IV SOLUTION) ×3 IMPLANT
YANKAUER SUCT BULB TIP NO VENT (SUCTIONS) ×3 IMPLANT

## 2017-05-23 NOTE — Op Note (Signed)
NAME:  Jeff Cunningham          MEDICAL RECORD NO.:  [id number]  PHYSICIAN:  Phylliss Bob, MD      DATE OF BIRTH:  11/14/1950  DATE OF PROCEDURE:  05/23/1950                              OPERATIVE REPORT   PREOPERATIVE DIAGNOSES: 1. Left-sided S1 radiculopathy. 2. Left-sided L5-S1 disk herniation causing severe     compression of the left S1 nerve.  POSTOPERATIVE DIAGNOSES: 1. Left-sided S1 radiculopathy. 2. Left-sided L5-S1 disk herniation causing severe     compression of the left S1 nerve.  PROCEDURES:  Left-sided L5-S1 laminotomy with partial facetectomy and removal of large herniated left-sided L5-S1 disk fragment.  SURGEON:  Phylliss Bob, MD.  ASSISTANTPricilla Holm, PA-C.  ANESTHESIA:  General endotracheal anesthesia.  COMPLICATIONS:  None.  DISPOSITION:  Stable.  ESTIMATED BLOOD LOSS:  Minimal.  INDICATIONS FOR SURGERY:  Briefly, Mr. Salminen is a pleasant 67 year old patient, who did present to me with severe pain in the left leg.  The patient's MRI did reveal the findings outlined above, clearly notable for a herniated disk fragment. The pain was rather severe and he did have weakness in the leg as well.  We did discuss treatment options and we did ultimately elect to proceed with the procedure reflected above.  The patient was fully made aware of the risks of surgery, including the risk of recurrent herniation and the need for subsequent surgery, including the possibility of a subsequent diskectomy and/or fusion.  OPERATIVE DETAILS:  On 4/3/82019, the patient was brought to surgery and general endotracheal anesthesia was administered.  The patient was placed prone on a well-padded flat Jackson bed with a spinal frame.  Antibiotics were given.  The back was prepped and draped and a time-out procedure was performed.  At this point, a midline incision was made directly over the L5-S1 intervertebral space.  A curvilinear incision was made just to  the left of the midline into the fascia.  A self-retaining McCulloch retractor was placed.  The lamina of L5 and S1 was identified and subperiosteally exposed.  I then removed the lateral aspect of the L5-S1 ligamentum flavum.  Readily identified was the traversing left S1 nerve, which was noted to be under obvious tension, and was noted to be rather erythematous.  I was able to gently gain medial retraction of the nerve, and in doing so, a large herniated disk fragment was readily noted.  This was removed in mulitple  fragments.  This substantially lessened the tension on the nerve.  I then additionally explored the lateral recess, and with additional medial retraction of the nerve, an additional herniated fragments were  encountered, and was uneventfully removed.  I was very pleased with the final decompression that I was able to accomplish.  At this point, the wound was copiously irrigated with normal saline.  All epidural bleeding was controlled using bipolar electrocautery in addition to Surgiflo. All bleeding was controlled at the termination of the procedure.  At this point, 20 mg of Depo-Medrol was introduced about the epidural space in the region of the left S1 nerve.  The wound was then closed in layers using #1 Vicryl followed by 0 Vicryl, followed by 4-0 Monocryl. Benzoin and Steri-Strips were applied followed by a sterile dressing. All instrument counts were correct at the termination of the procedure.  Of note, Kayla  McKenzie was my assistant throughout surgery, and did aid in retraction, suctioning, and closure from start to finish.     Phylliss Bob, MD

## 2017-05-23 NOTE — Transfer of Care (Signed)
Immediate Anesthesia Transfer of Care Note  Patient: Jeff Cunningham  Procedure(s) Performed: LEFT SIDED LUMBAR 5 SACRUM 1 MICRODISCECTOMY (Left Spine Lumbar)  Patient Location: PACU  Anesthesia Type:General  Level of Consciousness: awake and drowsy  Airway & Oxygen Therapy: Patient Spontanous Breathing and Patient connected to nasal cannula oxygen  Post-op Assessment: Report given to RN, Post -op Vital signs reviewed and stable and Patient moving all extremities  Post vital signs: Reviewed and stable  Last Vitals:  Vitals Value Taken Time  BP 127/78 05/23/2017 11:05 AM  Temp    Pulse 72 05/23/2017 11:07 AM  Resp 21 05/23/2017 11:07 AM  SpO2 99 % 05/23/2017 11:07 AM  Vitals shown include unvalidated device data.  Last Pain: There were no vitals filed for this visit.    Patients Stated Pain Goal: 6 (54/65/03 5465)  Complications: No apparent anesthesia complications

## 2017-05-23 NOTE — Anesthesia Procedure Notes (Signed)
Procedure Name: Intubation Date/Time: 05/23/2017 8:59 AM Performed by: Paizley Ramella T, CRNA Pre-anesthesia Checklist: Patient identified, Emergency Drugs available, Suction available and Patient being monitored Patient Re-evaluated:Patient Re-evaluated prior to induction Oxygen Delivery Method: Circle system utilized Preoxygenation: Pre-oxygenation with 100% oxygen Induction Type: IV induction Ventilation: Mask ventilation without difficulty Laryngoscope Size: Miller and 3 Grade View: Grade I Tube type: Oral Tube size: 7.5 mm Number of attempts: 1 Airway Equipment and Method: Patient positioned with wedge pillow and Stylet Placement Confirmation: ETT inserted through vocal cords under direct vision,  positive ETCO2 and breath sounds checked- equal and bilateral Secured at: 22 cm Tube secured with: Tape Dental Injury: Teeth and Oropharynx as per pre-operative assessment

## 2017-05-23 NOTE — H&P (Signed)
PREOPERATIVE H&P  Chief Complaint: Left leg pain  HPI: Jeff Cunningham is a 67 y.o. male who presents with ongoing pain in the left leg  MRI reveals a left L5/S1 HNP  Patient has failed multiple forms of conservative care and continues to have pain (see office notes for additional details regarding the patient's full course of treatment)  Past Medical History:  Diagnosis Date  . Arthritis   . Carpal tunnel syndrome   . DDD (degenerative disc disease), cervical   . Depression   . Diabetes mellitus   . GERD (gastroesophageal reflux disease)   . Gout   . Hyperlipidemia   . Hypertension   . Snores   . Wears glasses    Past Surgical History:  Procedure Laterality Date  . CARPAL TUNNEL RELEASE  2008   rt  . CARPAL TUNNEL RELEASE  01/16/2012   Procedure: CARPAL TUNNEL RELEASE;  Surgeon: Hessie Dibble, MD;  Location: [place];  Service: Orthopedics;  Laterality: Left;  . COLONOSCOPY    . SHOULDER ARTHROSCOPY  1983   left  . SHOULDER SURGERY    . STERIOD INJECTION Right 05/12/2013   Procedure:   RIGHT WRIST DEQUERVAINS INJECTION;  Surgeon: Jolyn Nap, MD;  Location: [place];  Service: Orthopedics;  Laterality: Right;  . ULNAR NERVE TRANSPOSITION Right 12/30/2012   Procedure: RIGHT ULNAR NEUROPLASTY AT ELBOW ;  Surgeon: Jolyn Nap, MD;  Location: [place];  Service: Orthopedics;  Laterality: Right;  . ULNAR NERVE TRANSPOSITION Left 05/12/2013   Procedure: LEFT ULNAR NEUROPLASTY AT ELBOW;  Surgeon: Jolyn Nap, MD;  Location: [place];  Service: Orthopedics;  Laterality: Left;  . ULNAR NERVE TRANSPOSITION Left 04/20/2015   Procedure: LEFT ULNAR NERVE TRANSPOSITION;  Surgeon: Milly Jakob, MD;  Location: [place];  Service: Orthopedics;  Laterality: Left;   Social History   Socioeconomic History  . Marital status: Single    Spouse name: Not on file  . Number of  children: Not on file  . Years of education: Not on file  . Highest education level: Not on file  Occupational History  . Not on file  Social Needs  . Financial resource strain: Not on file  . Food insecurity:    Worry: Not on file    Inability: Not on file  . Transportation needs:    Medical: Not on file    Non-medical: Not on file  Tobacco Use  . Smoking status: Never Smoker  . Smokeless tobacco: Never Used  Substance and Sexual Activity  . Alcohol use: Yes    Comment: occaionally  . Drug use: No  . Sexual activity: Not on file  Lifestyle  . Physical activity:    Days per week: Not on file    Minutes per session: Not on file  . Stress: Not on file  Relationships  . Social connections:    Talks on phone: Not on file    Gets together: Not on file    Attends religious service: Not on file    Active member of club or organization: Not on file    Attends meetings of clubs or organizations: Not on file    Relationship status: Not on file  Other Topics Concern  . Not on file  Social History Narrative  . Not on file   Family History  Problem Relation Age of Onset  . Colon cancer Neg Hx   .  Esophageal cancer Neg Hx   . Pancreatic cancer Neg Hx   . Prostate cancer Neg Hx   . Rectal cancer Neg Hx   . Stomach cancer Neg Hx    Allergies  Allergen Reactions  . Statins     Per pt statin drugs cause muscle aches  . Iodine Rash   Prior to Admission medications   Medication Sig Start Date End Date Taking? Authorizing Provider  allopurinol (ZYLOPRIM) 300 MG tablet Take 300 mg by mouth daily.   Yes [provider]  amLODipine (NORVASC) 10 MG tablet Take 10 mg by mouth daily.   Yes [provider]  aspirin EC 81 MG tablet Take 81 mg by mouth daily.   Yes [provider]  Bisacodyl (DULCOLAX PO) Take by mouth once.   Yes [provider]  losartan (COZAAR) 100 MG tablet Take 100 mg by mouth daily.   Yes [provider]  metFORMIN  (GLUCOPHAGE) 500 MG tablet Take 500 mg by mouth 2 (two) times daily with a meal.   Yes [provider]  metoprolol tartrate (LOPRESSOR) 25 MG tablet Take 25 mg by mouth 2 (two) times daily.   Yes [provider]  oxyCODONE-acetaminophen (ROXICET) 5-325 MG tablet Take 1-2 tablets by mouth every 4 (four) hours as needed. 04/20/15  Yes Milly Jakob, MD  polyethylene glycol powder [facility]) powder Take 1 Container by mouth once.   Yes [provider]  ranitidine (ZANTAC) 150 MG tablet Take 150 mg by mouth 2 (two) times daily.   Yes [provider]  rosuvastatin (CRESTOR) 20 MG tablet Take 20 mg by mouth daily.   Yes [provider]  traZODone (DESYREL) 50 MG tablet Take 50 mg by mouth at bedtime.  10/15/12  Yes [provider]     All other systems have been reviewed and were otherwise negative with the exception of those mentioned in the HPI and as above.  Physical Exam: Vitals:   05/23/17 0657  BP: 128/70  Pulse: (!) 58  Resp: 18  Temp: (!) 97.5 F (36.4 C)  SpO2: 98%    Body mass index is 34.14 kg/m.  General: Alert, no acute distress Cardiovascular: No pedal edema Respiratory: No cyanosis, no use of accessory musculature Skin: No lesions in the area of chief complaint Neurologic: Sensation intact distally Psychiatric: Patient is competent for consent with normal mood and affect Lymphatic: No axillary or cervical lymphadenopathy  MUSCULOSKELETAL: + SLR on the left  Assessment/Plan: LEFT LEG PAIN Plan for Procedure(s): LEFT SIDED LUMBAR 5 SACRUM 1 MICRODISCECTOMY   Sinclair Ship, MD 05/23/2017 7:37 AM

## 2017-05-23 NOTE — Anesthesia Postprocedure Evaluation (Signed)
Anesthesia Post Note  Patient: Jeff Cunningham  Procedure(s) Performed: LEFT SIDED LUMBAR 5 SACRUM 1 MICRODISCECTOMY (Left Spine Lumbar)     Patient location during evaluation: PACU Anesthesia Type: General Level of consciousness: awake and alert, oriented and patient cooperative Pain management: pain level controlled Vital Signs Assessment: post-procedure vital signs reviewed and stable Respiratory status: spontaneous breathing, nonlabored ventilation and respiratory function stable Cardiovascular status: blood pressure returned to baseline and stable Postop Assessment: no apparent nausea or vomiting Anesthetic complications: no    Last Vitals:  Vitals:   05/23/17 1145 05/23/17 1150  BP:  134/83  Pulse: 69 75  Resp: 16 16  Temp:  36.6 C  SpO2: 96% 96%    Last Pain:  Vitals:   05/23/17 1150  PainSc: 3                  Briani Maul,E. Avnoor Koury

## 2017-05-24 ENCOUNTER — Encounter (HOSPITAL_COMMUNITY): Payer: Self-pay | Admitting: Orthopedic Surgery

## 2017-05-24 MED FILL — Thrombin (Recombinant) For Soln 20000 Unit: CUTANEOUS | Qty: 1 | Status: AC

## 2017-05-30 DIAGNOSIS — M9983 Other biomechanical lesions of lumbar region: Secondary | ICD-10-CM | POA: Diagnosis not present

## 2017-05-30 DIAGNOSIS — M47812 Spondylosis without myelopathy or radiculopathy, cervical region: Secondary | ICD-10-CM | POA: Diagnosis not present

## 2017-05-30 DIAGNOSIS — Z79891 Long term (current) use of opiate analgesic: Secondary | ICD-10-CM | POA: Diagnosis not present

## 2017-05-30 DIAGNOSIS — M48061 Spinal stenosis, lumbar region without neurogenic claudication: Secondary | ICD-10-CM | POA: Diagnosis not present

## 2017-05-30 DIAGNOSIS — M5136 Other intervertebral disc degeneration, lumbar region: Secondary | ICD-10-CM | POA: Diagnosis not present

## 2017-05-30 DIAGNOSIS — M159 Polyosteoarthritis, unspecified: Secondary | ICD-10-CM | POA: Diagnosis not present

## 2017-05-30 DIAGNOSIS — E119 Type 2 diabetes mellitus without complications: Secondary | ICD-10-CM | POA: Diagnosis not present

## 2017-05-30 DIAGNOSIS — G894 Chronic pain syndrome: Secondary | ICD-10-CM | POA: Diagnosis not present

## 2017-05-30 DIAGNOSIS — M1990 Unspecified osteoarthritis, unspecified site: Secondary | ICD-10-CM | POA: Diagnosis not present

## 2017-05-30 DIAGNOSIS — M47816 Spondylosis without myelopathy or radiculopathy, lumbar region: Secondary | ICD-10-CM | POA: Diagnosis not present

## 2017-06-06 DIAGNOSIS — Z9889 Other specified postprocedural states: Secondary | ICD-10-CM | POA: Diagnosis not present

## 2017-06-20 DIAGNOSIS — M5136 Other intervertebral disc degeneration, lumbar region: Secondary | ICD-10-CM | POA: Diagnosis not present

## 2017-06-20 DIAGNOSIS — M47816 Spondylosis without myelopathy or radiculopathy, lumbar region: Secondary | ICD-10-CM | POA: Diagnosis not present

## 2017-06-20 DIAGNOSIS — E119 Type 2 diabetes mellitus without complications: Secondary | ICD-10-CM | POA: Diagnosis not present

## 2017-06-20 DIAGNOSIS — M1990 Unspecified osteoarthritis, unspecified site: Secondary | ICD-10-CM | POA: Diagnosis not present

## 2017-06-20 DIAGNOSIS — M48061 Spinal stenosis, lumbar region without neurogenic claudication: Secondary | ICD-10-CM | POA: Diagnosis not present

## 2017-06-20 DIAGNOSIS — M47812 Spondylosis without myelopathy or radiculopathy, cervical region: Secondary | ICD-10-CM | POA: Diagnosis not present

## 2017-06-20 DIAGNOSIS — Z79891 Long term (current) use of opiate analgesic: Secondary | ICD-10-CM | POA: Diagnosis not present

## 2017-06-20 DIAGNOSIS — G894 Chronic pain syndrome: Secondary | ICD-10-CM | POA: Diagnosis not present

## 2017-06-20 DIAGNOSIS — M159 Polyosteoarthritis, unspecified: Secondary | ICD-10-CM | POA: Diagnosis not present

## 2017-06-20 DIAGNOSIS — M9983 Other biomechanical lesions of lumbar region: Secondary | ICD-10-CM | POA: Diagnosis not present

## 2017-07-02 DIAGNOSIS — Z9889 Other specified postprocedural states: Secondary | ICD-10-CM | POA: Diagnosis not present

## 2017-07-11 DIAGNOSIS — M5417 Radiculopathy, lumbosacral region: Secondary | ICD-10-CM | POA: Diagnosis not present

## 2017-07-11 DIAGNOSIS — Z9889 Other specified postprocedural states: Secondary | ICD-10-CM | POA: Diagnosis not present

## 2017-07-11 DIAGNOSIS — M545 Low back pain: Secondary | ICD-10-CM | POA: Diagnosis not present

## 2017-07-11 DIAGNOSIS — M5127 Other intervertebral disc displacement, lumbosacral region: Secondary | ICD-10-CM | POA: Diagnosis not present

## 2017-07-11 DIAGNOSIS — R293 Abnormal posture: Secondary | ICD-10-CM | POA: Diagnosis not present

## 2017-07-11 DIAGNOSIS — M6281 Muscle weakness (generalized): Secondary | ICD-10-CM | POA: Diagnosis not present

## 2017-07-11 DIAGNOSIS — R201 Hypoesthesia of skin: Secondary | ICD-10-CM | POA: Diagnosis not present

## 2017-07-11 DIAGNOSIS — R2689 Other abnormalities of gait and mobility: Secondary | ICD-10-CM | POA: Diagnosis not present

## 2017-07-11 DIAGNOSIS — M2560 Stiffness of unspecified joint, not elsewhere classified: Secondary | ICD-10-CM | POA: Diagnosis not present

## 2017-07-11 DIAGNOSIS — R5383 Other fatigue: Secondary | ICD-10-CM | POA: Diagnosis not present

## 2017-07-18 DIAGNOSIS — M545 Low back pain: Secondary | ICD-10-CM | POA: Diagnosis not present

## 2017-07-18 DIAGNOSIS — M159 Polyosteoarthritis, unspecified: Secondary | ICD-10-CM | POA: Diagnosis not present

## 2017-07-18 DIAGNOSIS — M47812 Spondylosis without myelopathy or radiculopathy, cervical region: Secondary | ICD-10-CM | POA: Diagnosis not present

## 2017-07-18 DIAGNOSIS — M5417 Radiculopathy, lumbosacral region: Secondary | ICD-10-CM | POA: Diagnosis not present

## 2017-07-18 DIAGNOSIS — M1990 Unspecified osteoarthritis, unspecified site: Secondary | ICD-10-CM | POA: Diagnosis not present

## 2017-07-18 DIAGNOSIS — R201 Hypoesthesia of skin: Secondary | ICD-10-CM | POA: Diagnosis not present

## 2017-07-18 DIAGNOSIS — E119 Type 2 diabetes mellitus without complications: Secondary | ICD-10-CM | POA: Diagnosis not present

## 2017-07-18 DIAGNOSIS — M5136 Other intervertebral disc degeneration, lumbar region: Secondary | ICD-10-CM | POA: Diagnosis not present

## 2017-07-18 DIAGNOSIS — M9983 Other biomechanical lesions of lumbar region: Secondary | ICD-10-CM | POA: Diagnosis not present

## 2017-07-18 DIAGNOSIS — M5127 Other intervertebral disc displacement, lumbosacral region: Secondary | ICD-10-CM | POA: Diagnosis not present

## 2017-07-18 DIAGNOSIS — M48061 Spinal stenosis, lumbar region without neurogenic claudication: Secondary | ICD-10-CM | POA: Diagnosis not present

## 2017-07-18 DIAGNOSIS — Z79891 Long term (current) use of opiate analgesic: Secondary | ICD-10-CM | POA: Diagnosis not present

## 2017-07-18 DIAGNOSIS — Z9889 Other specified postprocedural states: Secondary | ICD-10-CM | POA: Diagnosis not present

## 2017-07-18 DIAGNOSIS — R5383 Other fatigue: Secondary | ICD-10-CM | POA: Diagnosis not present

## 2017-07-18 DIAGNOSIS — G894 Chronic pain syndrome: Secondary | ICD-10-CM | POA: Diagnosis not present

## 2017-07-18 DIAGNOSIS — M47816 Spondylosis without myelopathy or radiculopathy, lumbar region: Secondary | ICD-10-CM | POA: Diagnosis not present

## 2017-07-20 DIAGNOSIS — R201 Hypoesthesia of skin: Secondary | ICD-10-CM | POA: Diagnosis not present

## 2017-07-20 DIAGNOSIS — M545 Low back pain: Secondary | ICD-10-CM | POA: Diagnosis not present

## 2017-07-20 DIAGNOSIS — Z9889 Other specified postprocedural states: Secondary | ICD-10-CM | POA: Diagnosis not present

## 2017-07-20 DIAGNOSIS — M5417 Radiculopathy, lumbosacral region: Secondary | ICD-10-CM | POA: Diagnosis not present

## 2017-07-20 DIAGNOSIS — M5127 Other intervertebral disc displacement, lumbosacral region: Secondary | ICD-10-CM | POA: Diagnosis not present

## 2017-07-20 DIAGNOSIS — R5383 Other fatigue: Secondary | ICD-10-CM | POA: Diagnosis not present

## 2017-07-23 DIAGNOSIS — R201 Hypoesthesia of skin: Secondary | ICD-10-CM | POA: Diagnosis not present

## 2017-07-23 DIAGNOSIS — R5383 Other fatigue: Secondary | ICD-10-CM | POA: Diagnosis not present

## 2017-07-23 DIAGNOSIS — M545 Low back pain: Secondary | ICD-10-CM | POA: Diagnosis not present

## 2017-07-23 DIAGNOSIS — Z9889 Other specified postprocedural states: Secondary | ICD-10-CM | POA: Diagnosis not present

## 2017-07-23 DIAGNOSIS — M6281 Muscle weakness (generalized): Secondary | ICD-10-CM | POA: Diagnosis not present

## 2017-07-23 DIAGNOSIS — M2560 Stiffness of unspecified joint, not elsewhere classified: Secondary | ICD-10-CM | POA: Diagnosis not present

## 2017-07-23 DIAGNOSIS — R2689 Other abnormalities of gait and mobility: Secondary | ICD-10-CM | POA: Diagnosis not present

## 2017-07-23 DIAGNOSIS — R293 Abnormal posture: Secondary | ICD-10-CM | POA: Diagnosis not present

## 2017-07-23 DIAGNOSIS — M5417 Radiculopathy, lumbosacral region: Secondary | ICD-10-CM | POA: Diagnosis not present

## 2017-07-23 DIAGNOSIS — M5127 Other intervertebral disc displacement, lumbosacral region: Secondary | ICD-10-CM | POA: Diagnosis not present

## 2017-07-25 DIAGNOSIS — M5127 Other intervertebral disc displacement, lumbosacral region: Secondary | ICD-10-CM | POA: Diagnosis not present

## 2017-07-25 DIAGNOSIS — R201 Hypoesthesia of skin: Secondary | ICD-10-CM | POA: Diagnosis not present

## 2017-07-25 DIAGNOSIS — Z9889 Other specified postprocedural states: Secondary | ICD-10-CM | POA: Diagnosis not present

## 2017-07-25 DIAGNOSIS — M5417 Radiculopathy, lumbosacral region: Secondary | ICD-10-CM | POA: Diagnosis not present

## 2017-07-25 DIAGNOSIS — M545 Low back pain: Secondary | ICD-10-CM | POA: Diagnosis not present

## 2017-07-25 DIAGNOSIS — R5383 Other fatigue: Secondary | ICD-10-CM | POA: Diagnosis not present

## 2017-07-30 DIAGNOSIS — R201 Hypoesthesia of skin: Secondary | ICD-10-CM | POA: Diagnosis not present

## 2017-07-30 DIAGNOSIS — M5417 Radiculopathy, lumbosacral region: Secondary | ICD-10-CM | POA: Diagnosis not present

## 2017-07-30 DIAGNOSIS — Z9889 Other specified postprocedural states: Secondary | ICD-10-CM | POA: Diagnosis not present

## 2017-07-30 DIAGNOSIS — M5127 Other intervertebral disc displacement, lumbosacral region: Secondary | ICD-10-CM | POA: Diagnosis not present

## 2017-07-30 DIAGNOSIS — M545 Low back pain: Secondary | ICD-10-CM | POA: Diagnosis not present

## 2017-07-30 DIAGNOSIS — R5383 Other fatigue: Secondary | ICD-10-CM | POA: Diagnosis not present

## 2017-08-01 DIAGNOSIS — M545 Low back pain: Secondary | ICD-10-CM | POA: Diagnosis not present

## 2017-08-01 DIAGNOSIS — R5383 Other fatigue: Secondary | ICD-10-CM | POA: Diagnosis not present

## 2017-08-01 DIAGNOSIS — M5127 Other intervertebral disc displacement, lumbosacral region: Secondary | ICD-10-CM | POA: Diagnosis not present

## 2017-08-01 DIAGNOSIS — M5417 Radiculopathy, lumbosacral region: Secondary | ICD-10-CM | POA: Diagnosis not present

## 2017-08-01 DIAGNOSIS — R201 Hypoesthesia of skin: Secondary | ICD-10-CM | POA: Diagnosis not present

## 2017-08-01 DIAGNOSIS — Z9889 Other specified postprocedural states: Secondary | ICD-10-CM | POA: Diagnosis not present

## 2017-08-06 DIAGNOSIS — M5127 Other intervertebral disc displacement, lumbosacral region: Secondary | ICD-10-CM | POA: Diagnosis not present

## 2017-08-06 DIAGNOSIS — M545 Low back pain: Secondary | ICD-10-CM | POA: Diagnosis not present

## 2017-08-06 DIAGNOSIS — Z9889 Other specified postprocedural states: Secondary | ICD-10-CM | POA: Diagnosis not present

## 2017-08-06 DIAGNOSIS — M5417 Radiculopathy, lumbosacral region: Secondary | ICD-10-CM | POA: Diagnosis not present

## 2017-08-06 DIAGNOSIS — R5383 Other fatigue: Secondary | ICD-10-CM | POA: Diagnosis not present

## 2017-08-06 DIAGNOSIS — R201 Hypoesthesia of skin: Secondary | ICD-10-CM | POA: Diagnosis not present

## 2017-08-08 DIAGNOSIS — M5417 Radiculopathy, lumbosacral region: Secondary | ICD-10-CM | POA: Diagnosis not present

## 2017-08-08 DIAGNOSIS — R201 Hypoesthesia of skin: Secondary | ICD-10-CM | POA: Diagnosis not present

## 2017-08-08 DIAGNOSIS — R5383 Other fatigue: Secondary | ICD-10-CM | POA: Diagnosis not present

## 2017-08-08 DIAGNOSIS — M545 Low back pain: Secondary | ICD-10-CM | POA: Diagnosis not present

## 2017-08-08 DIAGNOSIS — M5127 Other intervertebral disc displacement, lumbosacral region: Secondary | ICD-10-CM | POA: Diagnosis not present

## 2017-08-08 DIAGNOSIS — Z9889 Other specified postprocedural states: Secondary | ICD-10-CM | POA: Diagnosis not present

## 2017-08-13 DIAGNOSIS — Z9889 Other specified postprocedural states: Secondary | ICD-10-CM | POA: Diagnosis not present

## 2017-08-15 DIAGNOSIS — R201 Hypoesthesia of skin: Secondary | ICD-10-CM | POA: Diagnosis not present

## 2017-08-15 DIAGNOSIS — Z9889 Other specified postprocedural states: Secondary | ICD-10-CM | POA: Diagnosis not present

## 2017-08-15 DIAGNOSIS — M5127 Other intervertebral disc displacement, lumbosacral region: Secondary | ICD-10-CM | POA: Diagnosis not present

## 2017-08-15 DIAGNOSIS — M545 Low back pain: Secondary | ICD-10-CM | POA: Diagnosis not present

## 2017-08-15 DIAGNOSIS — R5383 Other fatigue: Secondary | ICD-10-CM | POA: Diagnosis not present

## 2017-08-15 DIAGNOSIS — M5417 Radiculopathy, lumbosacral region: Secondary | ICD-10-CM | POA: Diagnosis not present

## 2017-08-22 DIAGNOSIS — M5136 Other intervertebral disc degeneration, lumbar region: Secondary | ICD-10-CM | POA: Diagnosis not present

## 2017-08-22 DIAGNOSIS — M47816 Spondylosis without myelopathy or radiculopathy, lumbar region: Secondary | ICD-10-CM | POA: Diagnosis not present

## 2017-08-22 DIAGNOSIS — M48061 Spinal stenosis, lumbar region without neurogenic claudication: Secondary | ICD-10-CM | POA: Diagnosis not present

## 2017-08-22 DIAGNOSIS — M9983 Other biomechanical lesions of lumbar region: Secondary | ICD-10-CM | POA: Diagnosis not present

## 2017-08-22 DIAGNOSIS — G894 Chronic pain syndrome: Secondary | ICD-10-CM | POA: Diagnosis not present

## 2017-08-22 DIAGNOSIS — M47812 Spondylosis without myelopathy or radiculopathy, cervical region: Secondary | ICD-10-CM | POA: Diagnosis not present

## 2017-08-22 DIAGNOSIS — M1990 Unspecified osteoarthritis, unspecified site: Secondary | ICD-10-CM | POA: Diagnosis not present

## 2017-08-22 DIAGNOSIS — Z79891 Long term (current) use of opiate analgesic: Secondary | ICD-10-CM | POA: Diagnosis not present

## 2017-08-22 DIAGNOSIS — E119 Type 2 diabetes mellitus without complications: Secondary | ICD-10-CM | POA: Diagnosis not present

## 2017-08-22 DIAGNOSIS — M159 Polyosteoarthritis, unspecified: Secondary | ICD-10-CM | POA: Diagnosis not present

## 2017-08-29 DIAGNOSIS — M5127 Other intervertebral disc displacement, lumbosacral region: Secondary | ICD-10-CM | POA: Diagnosis not present

## 2017-08-29 DIAGNOSIS — M2560 Stiffness of unspecified joint, not elsewhere classified: Secondary | ICD-10-CM | POA: Diagnosis not present

## 2017-08-29 DIAGNOSIS — R5383 Other fatigue: Secondary | ICD-10-CM | POA: Diagnosis not present

## 2017-08-29 DIAGNOSIS — R2689 Other abnormalities of gait and mobility: Secondary | ICD-10-CM | POA: Diagnosis not present

## 2017-08-29 DIAGNOSIS — Z9889 Other specified postprocedural states: Secondary | ICD-10-CM | POA: Diagnosis not present

## 2017-08-29 DIAGNOSIS — R293 Abnormal posture: Secondary | ICD-10-CM | POA: Diagnosis not present

## 2017-08-29 DIAGNOSIS — M6281 Muscle weakness (generalized): Secondary | ICD-10-CM | POA: Diagnosis not present

## 2017-08-29 DIAGNOSIS — R201 Hypoesthesia of skin: Secondary | ICD-10-CM | POA: Diagnosis not present

## 2017-08-29 DIAGNOSIS — M5417 Radiculopathy, lumbosacral region: Secondary | ICD-10-CM | POA: Diagnosis not present

## 2017-08-29 DIAGNOSIS — M545 Low back pain: Secondary | ICD-10-CM | POA: Diagnosis not present

## 2017-09-07 DIAGNOSIS — R5383 Other fatigue: Secondary | ICD-10-CM | POA: Diagnosis not present

## 2017-09-07 DIAGNOSIS — M5417 Radiculopathy, lumbosacral region: Secondary | ICD-10-CM | POA: Diagnosis not present

## 2017-09-07 DIAGNOSIS — R201 Hypoesthesia of skin: Secondary | ICD-10-CM | POA: Diagnosis not present

## 2017-09-07 DIAGNOSIS — Z9889 Other specified postprocedural states: Secondary | ICD-10-CM | POA: Diagnosis not present

## 2017-09-07 DIAGNOSIS — M5127 Other intervertebral disc displacement, lumbosacral region: Secondary | ICD-10-CM | POA: Diagnosis not present

## 2017-09-07 DIAGNOSIS — M545 Low back pain: Secondary | ICD-10-CM | POA: Diagnosis not present

## 2017-09-10 DIAGNOSIS — R5383 Other fatigue: Secondary | ICD-10-CM | POA: Diagnosis not present

## 2017-09-10 DIAGNOSIS — Z9889 Other specified postprocedural states: Secondary | ICD-10-CM | POA: Diagnosis not present

## 2017-09-10 DIAGNOSIS — M545 Low back pain: Secondary | ICD-10-CM | POA: Diagnosis not present

## 2017-09-10 DIAGNOSIS — R201 Hypoesthesia of skin: Secondary | ICD-10-CM | POA: Diagnosis not present

## 2017-09-10 DIAGNOSIS — M5417 Radiculopathy, lumbosacral region: Secondary | ICD-10-CM | POA: Diagnosis not present

## 2017-09-10 DIAGNOSIS — M5127 Other intervertebral disc displacement, lumbosacral region: Secondary | ICD-10-CM | POA: Diagnosis not present

## 2017-09-12 DIAGNOSIS — R5383 Other fatigue: Secondary | ICD-10-CM | POA: Diagnosis not present

## 2017-09-12 DIAGNOSIS — M5417 Radiculopathy, lumbosacral region: Secondary | ICD-10-CM | POA: Diagnosis not present

## 2017-09-12 DIAGNOSIS — M545 Low back pain: Secondary | ICD-10-CM | POA: Diagnosis not present

## 2017-09-12 DIAGNOSIS — Z9889 Other specified postprocedural states: Secondary | ICD-10-CM | POA: Diagnosis not present

## 2017-09-12 DIAGNOSIS — R201 Hypoesthesia of skin: Secondary | ICD-10-CM | POA: Diagnosis not present

## 2017-09-12 DIAGNOSIS — M5127 Other intervertebral disc displacement, lumbosacral region: Secondary | ICD-10-CM | POA: Diagnosis not present

## 2017-09-18 DIAGNOSIS — R5383 Other fatigue: Secondary | ICD-10-CM | POA: Diagnosis not present

## 2017-09-18 DIAGNOSIS — M5417 Radiculopathy, lumbosacral region: Secondary | ICD-10-CM | POA: Diagnosis not present

## 2017-09-18 DIAGNOSIS — M545 Low back pain: Secondary | ICD-10-CM | POA: Diagnosis not present

## 2017-09-18 DIAGNOSIS — Z9889 Other specified postprocedural states: Secondary | ICD-10-CM | POA: Diagnosis not present

## 2017-09-18 DIAGNOSIS — M5127 Other intervertebral disc displacement, lumbosacral region: Secondary | ICD-10-CM | POA: Diagnosis not present

## 2017-09-18 DIAGNOSIS — R201 Hypoesthesia of skin: Secondary | ICD-10-CM | POA: Diagnosis not present

## 2017-09-19 DIAGNOSIS — M545 Low back pain: Secondary | ICD-10-CM | POA: Diagnosis not present

## 2017-09-19 DIAGNOSIS — R5383 Other fatigue: Secondary | ICD-10-CM | POA: Diagnosis not present

## 2017-09-19 DIAGNOSIS — Z9889 Other specified postprocedural states: Secondary | ICD-10-CM | POA: Diagnosis not present

## 2017-09-19 DIAGNOSIS — R201 Hypoesthesia of skin: Secondary | ICD-10-CM | POA: Diagnosis not present

## 2017-09-19 DIAGNOSIS — M5127 Other intervertebral disc displacement, lumbosacral region: Secondary | ICD-10-CM | POA: Diagnosis not present

## 2017-09-19 DIAGNOSIS — M5417 Radiculopathy, lumbosacral region: Secondary | ICD-10-CM | POA: Diagnosis not present

## 2017-09-24 DIAGNOSIS — M5127 Other intervertebral disc displacement, lumbosacral region: Secondary | ICD-10-CM | POA: Diagnosis not present

## 2017-09-24 DIAGNOSIS — Z9889 Other specified postprocedural states: Secondary | ICD-10-CM | POA: Diagnosis not present

## 2017-09-24 DIAGNOSIS — M5417 Radiculopathy, lumbosacral region: Secondary | ICD-10-CM | POA: Diagnosis not present

## 2017-09-24 DIAGNOSIS — M2560 Stiffness of unspecified joint, not elsewhere classified: Secondary | ICD-10-CM | POA: Diagnosis not present

## 2017-09-24 DIAGNOSIS — M6281 Muscle weakness (generalized): Secondary | ICD-10-CM | POA: Diagnosis not present

## 2017-09-24 DIAGNOSIS — R201 Hypoesthesia of skin: Secondary | ICD-10-CM | POA: Diagnosis not present

## 2017-09-24 DIAGNOSIS — R2689 Other abnormalities of gait and mobility: Secondary | ICD-10-CM | POA: Diagnosis not present

## 2017-09-24 DIAGNOSIS — R293 Abnormal posture: Secondary | ICD-10-CM | POA: Diagnosis not present

## 2017-09-24 DIAGNOSIS — R5383 Other fatigue: Secondary | ICD-10-CM | POA: Diagnosis not present

## 2017-09-26 DIAGNOSIS — M47816 Spondylosis without myelopathy or radiculopathy, lumbar region: Secondary | ICD-10-CM | POA: Diagnosis not present

## 2017-09-26 DIAGNOSIS — R201 Hypoesthesia of skin: Secondary | ICD-10-CM | POA: Diagnosis not present

## 2017-09-26 DIAGNOSIS — M9983 Other biomechanical lesions of lumbar region: Secondary | ICD-10-CM | POA: Diagnosis not present

## 2017-09-26 DIAGNOSIS — R2689 Other abnormalities of gait and mobility: Secondary | ICD-10-CM | POA: Diagnosis not present

## 2017-09-26 DIAGNOSIS — G894 Chronic pain syndrome: Secondary | ICD-10-CM | POA: Diagnosis not present

## 2017-09-26 DIAGNOSIS — M47812 Spondylosis without myelopathy or radiculopathy, cervical region: Secondary | ICD-10-CM | POA: Diagnosis not present

## 2017-09-26 DIAGNOSIS — M5136 Other intervertebral disc degeneration, lumbar region: Secondary | ICD-10-CM | POA: Diagnosis not present

## 2017-09-26 DIAGNOSIS — M159 Polyosteoarthritis, unspecified: Secondary | ICD-10-CM | POA: Diagnosis not present

## 2017-09-26 DIAGNOSIS — R5383 Other fatigue: Secondary | ICD-10-CM | POA: Diagnosis not present

## 2017-09-26 DIAGNOSIS — M5417 Radiculopathy, lumbosacral region: Secondary | ICD-10-CM | POA: Diagnosis not present

## 2017-09-26 DIAGNOSIS — Z9889 Other specified postprocedural states: Secondary | ICD-10-CM | POA: Diagnosis not present

## 2017-09-26 DIAGNOSIS — M48061 Spinal stenosis, lumbar region without neurogenic claudication: Secondary | ICD-10-CM | POA: Diagnosis not present

## 2017-09-26 DIAGNOSIS — E119 Type 2 diabetes mellitus without complications: Secondary | ICD-10-CM | POA: Diagnosis not present

## 2017-09-26 DIAGNOSIS — M5127 Other intervertebral disc displacement, lumbosacral region: Secondary | ICD-10-CM | POA: Diagnosis not present

## 2017-09-26 DIAGNOSIS — M1990 Unspecified osteoarthritis, unspecified site: Secondary | ICD-10-CM | POA: Diagnosis not present

## 2017-09-26 DIAGNOSIS — Z79891 Long term (current) use of opiate analgesic: Secondary | ICD-10-CM | POA: Diagnosis not present

## 2017-10-08 DIAGNOSIS — E1140 Type 2 diabetes mellitus with diabetic neuropathy, unspecified: Secondary | ICD-10-CM | POA: Diagnosis not present

## 2017-10-08 DIAGNOSIS — M109 Gout, unspecified: Secondary | ICD-10-CM | POA: Diagnosis not present

## 2017-10-08 DIAGNOSIS — E7800 Pure hypercholesterolemia, unspecified: Secondary | ICD-10-CM | POA: Diagnosis not present

## 2017-10-08 DIAGNOSIS — Z6835 Body mass index (BMI) 35.0-35.9, adult: Secondary | ICD-10-CM | POA: Diagnosis not present

## 2017-10-08 DIAGNOSIS — I10 Essential (primary) hypertension: Secondary | ICD-10-CM | POA: Diagnosis not present

## 2017-10-09 DIAGNOSIS — M5417 Radiculopathy, lumbosacral region: Secondary | ICD-10-CM | POA: Diagnosis not present

## 2017-10-09 DIAGNOSIS — R5383 Other fatigue: Secondary | ICD-10-CM | POA: Diagnosis not present

## 2017-10-09 DIAGNOSIS — Z9889 Other specified postprocedural states: Secondary | ICD-10-CM | POA: Diagnosis not present

## 2017-10-09 DIAGNOSIS — R201 Hypoesthesia of skin: Secondary | ICD-10-CM | POA: Diagnosis not present

## 2017-10-09 DIAGNOSIS — R2689 Other abnormalities of gait and mobility: Secondary | ICD-10-CM | POA: Diagnosis not present

## 2017-10-09 DIAGNOSIS — M5127 Other intervertebral disc displacement, lumbosacral region: Secondary | ICD-10-CM | POA: Diagnosis not present

## 2017-10-10 DIAGNOSIS — Z9889 Other specified postprocedural states: Secondary | ICD-10-CM | POA: Diagnosis not present

## 2017-10-10 DIAGNOSIS — R2689 Other abnormalities of gait and mobility: Secondary | ICD-10-CM | POA: Diagnosis not present

## 2017-10-10 DIAGNOSIS — M5127 Other intervertebral disc displacement, lumbosacral region: Secondary | ICD-10-CM | POA: Diagnosis not present

## 2017-10-10 DIAGNOSIS — R5383 Other fatigue: Secondary | ICD-10-CM | POA: Diagnosis not present

## 2017-10-10 DIAGNOSIS — R201 Hypoesthesia of skin: Secondary | ICD-10-CM | POA: Diagnosis not present

## 2017-10-10 DIAGNOSIS — M5417 Radiculopathy, lumbosacral region: Secondary | ICD-10-CM | POA: Diagnosis not present

## 2017-10-16 DIAGNOSIS — R5383 Other fatigue: Secondary | ICD-10-CM | POA: Diagnosis not present

## 2017-10-16 DIAGNOSIS — R201 Hypoesthesia of skin: Secondary | ICD-10-CM | POA: Diagnosis not present

## 2017-10-16 DIAGNOSIS — M5127 Other intervertebral disc displacement, lumbosacral region: Secondary | ICD-10-CM | POA: Diagnosis not present

## 2017-10-16 DIAGNOSIS — M5417 Radiculopathy, lumbosacral region: Secondary | ICD-10-CM | POA: Diagnosis not present

## 2017-10-16 DIAGNOSIS — Z9889 Other specified postprocedural states: Secondary | ICD-10-CM | POA: Diagnosis not present

## 2017-10-16 DIAGNOSIS — R2689 Other abnormalities of gait and mobility: Secondary | ICD-10-CM | POA: Diagnosis not present

## 2017-10-18 DIAGNOSIS — Z9889 Other specified postprocedural states: Secondary | ICD-10-CM | POA: Diagnosis not present

## 2017-10-18 DIAGNOSIS — R5383 Other fatigue: Secondary | ICD-10-CM | POA: Diagnosis not present

## 2017-10-18 DIAGNOSIS — M5127 Other intervertebral disc displacement, lumbosacral region: Secondary | ICD-10-CM | POA: Diagnosis not present

## 2017-10-18 DIAGNOSIS — R201 Hypoesthesia of skin: Secondary | ICD-10-CM | POA: Diagnosis not present

## 2017-10-18 DIAGNOSIS — R2689 Other abnormalities of gait and mobility: Secondary | ICD-10-CM | POA: Diagnosis not present

## 2017-10-18 DIAGNOSIS — M5417 Radiculopathy, lumbosacral region: Secondary | ICD-10-CM | POA: Diagnosis not present

## 2017-10-23 DIAGNOSIS — M5417 Radiculopathy, lumbosacral region: Secondary | ICD-10-CM | POA: Diagnosis not present

## 2017-10-23 DIAGNOSIS — R201 Hypoesthesia of skin: Secondary | ICD-10-CM | POA: Diagnosis not present

## 2017-10-23 DIAGNOSIS — R293 Abnormal posture: Secondary | ICD-10-CM | POA: Diagnosis not present

## 2017-10-23 DIAGNOSIS — R5383 Other fatigue: Secondary | ICD-10-CM | POA: Diagnosis not present

## 2017-10-23 DIAGNOSIS — R2689 Other abnormalities of gait and mobility: Secondary | ICD-10-CM | POA: Diagnosis not present

## 2017-10-23 DIAGNOSIS — M2560 Stiffness of unspecified joint, not elsewhere classified: Secondary | ICD-10-CM | POA: Diagnosis not present

## 2017-10-23 DIAGNOSIS — M5127 Other intervertebral disc displacement, lumbosacral region: Secondary | ICD-10-CM | POA: Diagnosis not present

## 2017-10-23 DIAGNOSIS — M6281 Muscle weakness (generalized): Secondary | ICD-10-CM | POA: Diagnosis not present

## 2017-10-23 DIAGNOSIS — Z9889 Other specified postprocedural states: Secondary | ICD-10-CM | POA: Diagnosis not present

## 2017-10-26 DIAGNOSIS — R5383 Other fatigue: Secondary | ICD-10-CM | POA: Diagnosis not present

## 2017-10-26 DIAGNOSIS — R201 Hypoesthesia of skin: Secondary | ICD-10-CM | POA: Diagnosis not present

## 2017-10-26 DIAGNOSIS — M5417 Radiculopathy, lumbosacral region: Secondary | ICD-10-CM | POA: Diagnosis not present

## 2017-10-26 DIAGNOSIS — M5127 Other intervertebral disc displacement, lumbosacral region: Secondary | ICD-10-CM | POA: Diagnosis not present

## 2017-10-26 DIAGNOSIS — R2689 Other abnormalities of gait and mobility: Secondary | ICD-10-CM | POA: Diagnosis not present

## 2017-10-26 DIAGNOSIS — Z9889 Other specified postprocedural states: Secondary | ICD-10-CM | POA: Diagnosis not present

## 2017-10-29 DIAGNOSIS — R201 Hypoesthesia of skin: Secondary | ICD-10-CM | POA: Diagnosis not present

## 2017-10-29 DIAGNOSIS — M5127 Other intervertebral disc displacement, lumbosacral region: Secondary | ICD-10-CM | POA: Diagnosis not present

## 2017-10-29 DIAGNOSIS — R5383 Other fatigue: Secondary | ICD-10-CM | POA: Diagnosis not present

## 2017-10-29 DIAGNOSIS — M5417 Radiculopathy, lumbosacral region: Secondary | ICD-10-CM | POA: Diagnosis not present

## 2017-10-29 DIAGNOSIS — Z9889 Other specified postprocedural states: Secondary | ICD-10-CM | POA: Diagnosis not present

## 2017-10-29 DIAGNOSIS — R2689 Other abnormalities of gait and mobility: Secondary | ICD-10-CM | POA: Diagnosis not present

## 2017-10-31 DIAGNOSIS — M5136 Other intervertebral disc degeneration, lumbar region: Secondary | ICD-10-CM | POA: Diagnosis not present

## 2017-10-31 DIAGNOSIS — M48061 Spinal stenosis, lumbar region without neurogenic claudication: Secondary | ICD-10-CM | POA: Diagnosis not present

## 2017-10-31 DIAGNOSIS — G894 Chronic pain syndrome: Secondary | ICD-10-CM | POA: Diagnosis not present

## 2017-10-31 DIAGNOSIS — M159 Polyosteoarthritis, unspecified: Secondary | ICD-10-CM | POA: Diagnosis not present

## 2017-10-31 DIAGNOSIS — M47812 Spondylosis without myelopathy or radiculopathy, cervical region: Secondary | ICD-10-CM | POA: Diagnosis not present

## 2017-10-31 DIAGNOSIS — Z79891 Long term (current) use of opiate analgesic: Secondary | ICD-10-CM | POA: Diagnosis not present

## 2017-10-31 DIAGNOSIS — M47816 Spondylosis without myelopathy or radiculopathy, lumbar region: Secondary | ICD-10-CM | POA: Diagnosis not present

## 2017-10-31 DIAGNOSIS — E119 Type 2 diabetes mellitus without complications: Secondary | ICD-10-CM | POA: Diagnosis not present

## 2017-10-31 DIAGNOSIS — M961 Postlaminectomy syndrome, not elsewhere classified: Secondary | ICD-10-CM | POA: Diagnosis not present

## 2017-10-31 DIAGNOSIS — M1990 Unspecified osteoarthritis, unspecified site: Secondary | ICD-10-CM | POA: Diagnosis not present

## 2017-11-05 DIAGNOSIS — Z9889 Other specified postprocedural states: Secondary | ICD-10-CM | POA: Diagnosis not present

## 2017-11-05 DIAGNOSIS — R201 Hypoesthesia of skin: Secondary | ICD-10-CM | POA: Diagnosis not present

## 2017-11-05 DIAGNOSIS — R2689 Other abnormalities of gait and mobility: Secondary | ICD-10-CM | POA: Diagnosis not present

## 2017-11-05 DIAGNOSIS — M5417 Radiculopathy, lumbosacral region: Secondary | ICD-10-CM | POA: Diagnosis not present

## 2017-11-05 DIAGNOSIS — R5383 Other fatigue: Secondary | ICD-10-CM | POA: Diagnosis not present

## 2017-11-05 DIAGNOSIS — M5127 Other intervertebral disc displacement, lumbosacral region: Secondary | ICD-10-CM | POA: Diagnosis not present

## 2017-11-07 DIAGNOSIS — Z1389 Encounter for screening for other disorder: Secondary | ICD-10-CM | POA: Diagnosis not present

## 2017-11-07 DIAGNOSIS — Z0000 Encounter for general adult medical examination without abnormal findings: Secondary | ICD-10-CM | POA: Diagnosis not present

## 2017-11-09 DIAGNOSIS — M5417 Radiculopathy, lumbosacral region: Secondary | ICD-10-CM | POA: Diagnosis not present

## 2017-11-09 DIAGNOSIS — Z9889 Other specified postprocedural states: Secondary | ICD-10-CM | POA: Diagnosis not present

## 2017-11-09 DIAGNOSIS — R201 Hypoesthesia of skin: Secondary | ICD-10-CM | POA: Diagnosis not present

## 2017-11-09 DIAGNOSIS — R5383 Other fatigue: Secondary | ICD-10-CM | POA: Diagnosis not present

## 2017-11-09 DIAGNOSIS — R2689 Other abnormalities of gait and mobility: Secondary | ICD-10-CM | POA: Diagnosis not present

## 2017-11-09 DIAGNOSIS — M5127 Other intervertebral disc displacement, lumbosacral region: Secondary | ICD-10-CM | POA: Diagnosis not present

## 2017-11-28 DIAGNOSIS — M48061 Spinal stenosis, lumbar region without neurogenic claudication: Secondary | ICD-10-CM | POA: Diagnosis not present

## 2017-11-28 DIAGNOSIS — E119 Type 2 diabetes mellitus without complications: Secondary | ICD-10-CM | POA: Diagnosis not present

## 2017-11-28 DIAGNOSIS — M47816 Spondylosis without myelopathy or radiculopathy, lumbar region: Secondary | ICD-10-CM | POA: Diagnosis not present

## 2017-11-28 DIAGNOSIS — M47812 Spondylosis without myelopathy or radiculopathy, cervical region: Secondary | ICD-10-CM | POA: Diagnosis not present

## 2017-11-28 DIAGNOSIS — Z79891 Long term (current) use of opiate analgesic: Secondary | ICD-10-CM | POA: Diagnosis not present

## 2017-11-28 DIAGNOSIS — M1990 Unspecified osteoarthritis, unspecified site: Secondary | ICD-10-CM | POA: Diagnosis not present

## 2017-11-28 DIAGNOSIS — M961 Postlaminectomy syndrome, not elsewhere classified: Secondary | ICD-10-CM | POA: Diagnosis not present

## 2017-11-28 DIAGNOSIS — M159 Polyosteoarthritis, unspecified: Secondary | ICD-10-CM | POA: Diagnosis not present

## 2017-11-28 DIAGNOSIS — G894 Chronic pain syndrome: Secondary | ICD-10-CM | POA: Diagnosis not present

## 2017-11-28 DIAGNOSIS — M5136 Other intervertebral disc degeneration, lumbar region: Secondary | ICD-10-CM | POA: Diagnosis not present

## 2017-12-27 DIAGNOSIS — G894 Chronic pain syndrome: Secondary | ICD-10-CM | POA: Diagnosis not present

## 2017-12-27 DIAGNOSIS — M47816 Spondylosis without myelopathy or radiculopathy, lumbar region: Secondary | ICD-10-CM | POA: Diagnosis not present

## 2017-12-27 DIAGNOSIS — M47812 Spondylosis without myelopathy or radiculopathy, cervical region: Secondary | ICD-10-CM | POA: Diagnosis not present

## 2017-12-27 DIAGNOSIS — M961 Postlaminectomy syndrome, not elsewhere classified: Secondary | ICD-10-CM | POA: Diagnosis not present

## 2017-12-27 DIAGNOSIS — M5136 Other intervertebral disc degeneration, lumbar region: Secondary | ICD-10-CM | POA: Diagnosis not present

## 2017-12-27 DIAGNOSIS — M1990 Unspecified osteoarthritis, unspecified site: Secondary | ICD-10-CM | POA: Diagnosis not present

## 2017-12-27 DIAGNOSIS — E119 Type 2 diabetes mellitus without complications: Secondary | ICD-10-CM | POA: Diagnosis not present

## 2017-12-27 DIAGNOSIS — M159 Polyosteoarthritis, unspecified: Secondary | ICD-10-CM | POA: Diagnosis not present

## 2017-12-27 DIAGNOSIS — M48061 Spinal stenosis, lumbar region without neurogenic claudication: Secondary | ICD-10-CM | POA: Diagnosis not present

## 2017-12-27 DIAGNOSIS — Z79891 Long term (current) use of opiate analgesic: Secondary | ICD-10-CM | POA: Diagnosis not present

## 2018-01-22 DIAGNOSIS — M545 Low back pain: Secondary | ICD-10-CM | POA: Diagnosis not present

## 2018-01-24 DIAGNOSIS — M545 Low back pain: Secondary | ICD-10-CM | POA: Diagnosis not present

## 2018-01-25 DIAGNOSIS — M1990 Unspecified osteoarthritis, unspecified site: Secondary | ICD-10-CM | POA: Diagnosis not present

## 2018-01-25 DIAGNOSIS — M159 Polyosteoarthritis, unspecified: Secondary | ICD-10-CM | POA: Diagnosis not present

## 2018-01-25 DIAGNOSIS — Z79891 Long term (current) use of opiate analgesic: Secondary | ICD-10-CM | POA: Diagnosis not present

## 2018-01-25 DIAGNOSIS — M47816 Spondylosis without myelopathy or radiculopathy, lumbar region: Secondary | ICD-10-CM | POA: Diagnosis not present

## 2018-01-25 DIAGNOSIS — E119 Type 2 diabetes mellitus without complications: Secondary | ICD-10-CM | POA: Diagnosis not present

## 2018-01-25 DIAGNOSIS — G894 Chronic pain syndrome: Secondary | ICD-10-CM | POA: Diagnosis not present

## 2018-01-25 DIAGNOSIS — M48061 Spinal stenosis, lumbar region without neurogenic claudication: Secondary | ICD-10-CM | POA: Diagnosis not present

## 2018-01-25 DIAGNOSIS — M5136 Other intervertebral disc degeneration, lumbar region: Secondary | ICD-10-CM | POA: Diagnosis not present

## 2018-01-25 DIAGNOSIS — M47812 Spondylosis without myelopathy or radiculopathy, cervical region: Secondary | ICD-10-CM | POA: Diagnosis not present

## 2018-01-25 DIAGNOSIS — M961 Postlaminectomy syndrome, not elsewhere classified: Secondary | ICD-10-CM | POA: Diagnosis not present

## 2018-01-28 DIAGNOSIS — M5416 Radiculopathy, lumbar region: Secondary | ICD-10-CM | POA: Diagnosis not present

## 2018-02-01 DIAGNOSIS — M5416 Radiculopathy, lumbar region: Secondary | ICD-10-CM | POA: Diagnosis not present

## 2018-09-30 ENCOUNTER — Encounter: Payer: Self-pay | Admitting: Gastroenterology

## 2018-10-09 ENCOUNTER — Encounter: Payer: Self-pay | Admitting: Gastroenterology

## 2018-11-05 ENCOUNTER — Other Ambulatory Visit: Payer: Self-pay

## 2018-11-05 ENCOUNTER — Ambulatory Visit (AMBULATORY_SURGERY_CENTER): Payer: Self-pay | Admitting: *Deleted

## 2018-11-05 VITALS — Temp 98.2°F | Ht 67.0 in | Wt 226.0 lb

## 2018-11-05 DIAGNOSIS — Z86010 Personal history of colonic polyps: Secondary | ICD-10-CM

## 2018-11-05 MED ORDER — PEG 3350-KCL-NA BICARB-NACL 420 G PO SOLR: 4000.0000 mL | Freq: Once | ORAL | 0 refills | Status: AC

## 2018-11-05 NOTE — Progress Notes (Signed)
No egg or soy allergy known to patient  No issues with past sedation with any surgeries  or procedures, no intubation problems  No diet pills per patient No home 02 use per patient  No blood thinners per patient  Pt denies issues with constipation  Currently- he states he occ has an issue and uses Miarlax prn  No A fib or A flutter  EMMI video declined

## 2018-11-06 ENCOUNTER — Encounter: Payer: Self-pay | Admitting: Gastroenterology

## 2018-11-18 ENCOUNTER — Telehealth: Payer: Self-pay

## 2018-11-18 NOTE — Telephone Encounter (Signed)
Covid-19 screening questions   Do you now or have you had a fever in the last 14 days? NO   Do you have any respiratory symptoms of shortness of breath or cough now or in the last 14 days? NO  Do you have any family members or close contacts with diagnosed or suspected Covid-19 in the past 14 days? NO  Have you been tested for Covid-19 and found to be positive? NO        

## 2018-11-19 ENCOUNTER — Ambulatory Visit (AMBULATORY_SURGERY_CENTER): Payer: Medicare Other | Admitting: Gastroenterology

## 2018-11-19 ENCOUNTER — Other Ambulatory Visit: Payer: Self-pay

## 2018-11-19 ENCOUNTER — Encounter: Payer: Self-pay | Admitting: Gastroenterology

## 2018-11-19 VITALS — BP 116/64 | HR 71 | Temp 98.2°F | Resp 19 | Ht 67.0 in | Wt 226.0 lb

## 2018-11-19 DIAGNOSIS — D124 Benign neoplasm of descending colon: Secondary | ICD-10-CM

## 2018-11-19 DIAGNOSIS — Z86010 Personal history of colonic polyps: Secondary | ICD-10-CM | POA: Diagnosis not present

## 2018-11-19 MED ORDER — SODIUM CHLORIDE 0.9 % IV SOLN
500.0000 mL | Freq: Once | INTRAVENOUS | Status: DC
Start: 2018-11-19 — End: 2018-11-19

## 2018-11-19 NOTE — Op Note (Signed)
[facility] Patient Name: Giavanni Lano Procedure Date: 11/19/2018 9:21 AM MRN: [id number] Endoscopist: Ladene Artist , MD Age: 68 Referring MD:  Date of Birth: 01/26/1951 Gender: Male Account #: [id number] Procedure:                Colonoscopy Indications:              Surveillance: Personal history of adenomatous                            polyps on last colonoscopy 3 years ago Medicines:                Monitored Anesthesia Care Procedure:                Pre-Anesthesia Assessment:                           - Prior to the procedure, a History and Physical                            was performed, and patient medications and                            allergies were reviewed. The patient's tolerance of                            previous anesthesia was also reviewed. The risks                            and benefits of the procedure and the sedation                            options and risks were discussed with the patient.                            All questions were answered, and informed consent                            was obtained. Prior Anticoagulants: The patient has                            taken no previous anticoagulant or antiplatelet                            agents. ASA Grade Assessment: II - A patient with                            mild systemic disease. After reviewing the risks                            and benefits, the patient was deemed in                            satisfactory condition to undergo the procedure.  After obtaining informed consent, the colonoscope                            was passed under direct vision. Throughout the                            procedure, the patient's blood pressure, pulse, and                            oxygen saturations were monitored continuously. The                            Colonoscope was introduced through the anus and                            advanced to the the cecum,  identified by                            appendiceal orifice and ileocecal valve. The                            ileocecal valve, appendiceal orifice, and rectum                            were photographed. The quality of the bowel                            preparation was good. The colonoscopy was performed                            without difficulty. The patient tolerated the                            procedure well. Scope In: 9:29:09 AM Scope Out: 9:41:59 AM Scope Withdrawal Time: 0 hours 8 minutes 49 seconds  Total Procedure Duration: 0 hours 12 minutes 50 seconds  Findings:                 The perianal and digital rectal examinations were                            normal.                           A 7 mm polyp was found in the descending colon. The                            polyp was sessile. The polyp was removed with a                            cold snare. Resection and retrieval were complete.                           Internal hemorrhoids were found during  retroflexion. The hemorrhoids were small and Grade                            I (internal hemorrhoids that do not prolapse).                           The exam was otherwise without abnormality on                            direct and retroflexion views. Complications:            No immediate complications. Estimated blood loss:                            None. Estimated Blood Loss:     Estimated blood loss: none. Impression:               - One 7 mm polyp in the descending colon. Rection                            and rertieval.                           - Internal hemorrhoids.                           - The examination was otherwise normal on direct                            and retroflexion views.                           - No specimens collected. Recommendation:           - Repeat colonoscopy in 5 years for surveillance.                           - Patient has a contact number  available for                            emergencies. The signs and symptoms of potential                            delayed complications were discussed with the                            patient. Return to normal activities tomorrow.                            Written discharge instructions were provided to the                            patient.                           - Resume previous diet.                           -  Continue present medications.                           - Await pathology results. Ladene Artist, MD 11/19/2018 9:45:59 AM This report has been signed electronically.

## 2018-11-19 NOTE — Patient Instructions (Signed)
THank you for letting us take care of your healthcare needs today. Please see handouts on Polyps and Hemorrhoids.    YOU HAD AN ENDOSCOPIC PROCEDURE TODAY AT [facility]:   Refer to the procedure report that was given to you for any specific questions about what was found during the examination.  If the procedure report does not answer your questions, please call your gastroenterologist to clarify.  If you requested that your care partner not be given the details of your procedure findings, then the procedure report has been included in a sealed envelope for you to review at your convenience later.  YOU SHOULD EXPECT: Some feelings of bloating in the abdomen. Passage of more gas than usual.  Walking can help get rid of the air that was put into your GI tract during the procedure and reduce the bloating. If you had a lower endoscopy (such as a colonoscopy or flexible sigmoidoscopy) you may notice spotting of blood in your stool or on the toilet paper. If you underwent a bowel prep for your procedure, you may not have a normal bowel movement for a few days.  Please Note:  You might notice some irritation and congestion in your nose or some drainage.  This is from the oxygen used during your procedure.  There is no need for concern and it should clear up in a day or so.  SYMPTOMS TO REPORT IMMEDIATELY:   Following lower endoscopy (colonoscopy or flexible sigmoidoscopy):  Excessive amounts of blood in the stool  Significant tenderness or worsening of abdominal pains  Swelling of the abdomen that is new, acute  Fever of 100F or higher   For urgent or emergent issues, a gastroenterologist can be reached at any hour by calling [phone].   DIET:  We do recommend a small meal at first, but then you may proceed to your regular diet.  Drink plenty of fluids but you should avoid alcoholic beverages for 24 hours.  ACTIVITY:  You should plan to take it easy for the rest of today  and you should NOT DRIVE or use heavy machinery until tomorrow (because of the sedation medicines used during the test).    FOLLOW UP: Our staff will call the number listed on your records 48-72 hours following your procedure to check on you and address any questions or concerns that you may have regarding the information given to you following your procedure. If we do not reach you, we will leave a message.  We will attempt to reach you two times.  During this call, we will ask if you have developed any symptoms of COVID 19. If you develop any symptoms (ie: fever, flu-like symptoms, shortness of breath, cough etc.) before then, please call [phone].  If you test positive for Covid 19 in the 2 weeks post procedure, please call and report this information to [place].    If any biopsies were taken you will be contacted by phone or by letter within the next 1-3 weeks.  Please call us at [phone] if you have not heard about the biopsies in 3 weeks.    SIGNATURES/CONFIDENTIALITY: You and/or your care partner have signed paperwork which will be entered into your electronic medical record.  These signatures attest to the fact that that the information above on your After Visit Summary has been reviewed and is understood.  Full responsibility of the confidentiality of this discharge information lies with you and/or your care-partner.

## 2018-11-19 NOTE — Progress Notes (Signed)
Temperature- Jeff Cunningham VS- [name]  Pt's states no medical or surgical changes since previsit or office visit.  

## 2018-11-19 NOTE — Progress Notes (Signed)
Called to room to assist during endoscopic procedure.  Patient ID and intended procedure confirmed with present staff. Received instructions for my participation in the procedure from the performing physician.  

## 2018-11-21 ENCOUNTER — Telehealth: Payer: Self-pay

## 2018-11-21 NOTE — Telephone Encounter (Signed)
Left message on answering machine. 

## 2018-11-21 NOTE — Telephone Encounter (Signed)
  Follow up Call-  Call back number 11/19/2018  Post procedure Call Back phone  # [phone]  Permission to leave phone message Yes  Some recent data might be hidden     Patient questions:  Do you have a fever, pain , or abdominal swelling? No. Pain Score  0 *  Have you tolerated food without any problems? Yes.    Have you been able to return to your normal activities? Yes.    Do you have any questions about your discharge instructions: Diet   No. Medications  No. Follow up visit  No  Do you have questions or concerns about your Care? No.  Actions: * If pain score is 4 or above: 1. No action needed, pain <4.Have you developed a fever since your procedure? no  2.   Have you had an respiratory symptoms (SOB or cough) since your procedure? no  3.   Have you tested positive for COVID 19 since your procedure no  4.   Have you had any family members/close contacts diagnosed with the COVID 19 since your procedure?  no   If yes to any of these questions please route to Joylene John, RN and Alphonsa Gin, Therapist, sports.

## 2018-11-22 ENCOUNTER — Encounter: Payer: Self-pay | Admitting: Gastroenterology

## 2019-05-26 IMAGING — CR DG CHEST 2V
2 series · 2 of 2 positions shown · non-contrast
Comparison: None in PACs

CLINICAL DATA: Preoperative study prior lumbar surgery. History of
diabetes and hypertension. Nonsmoker.

EXAM:
CHEST - 2 VIEW

[w chest pa]
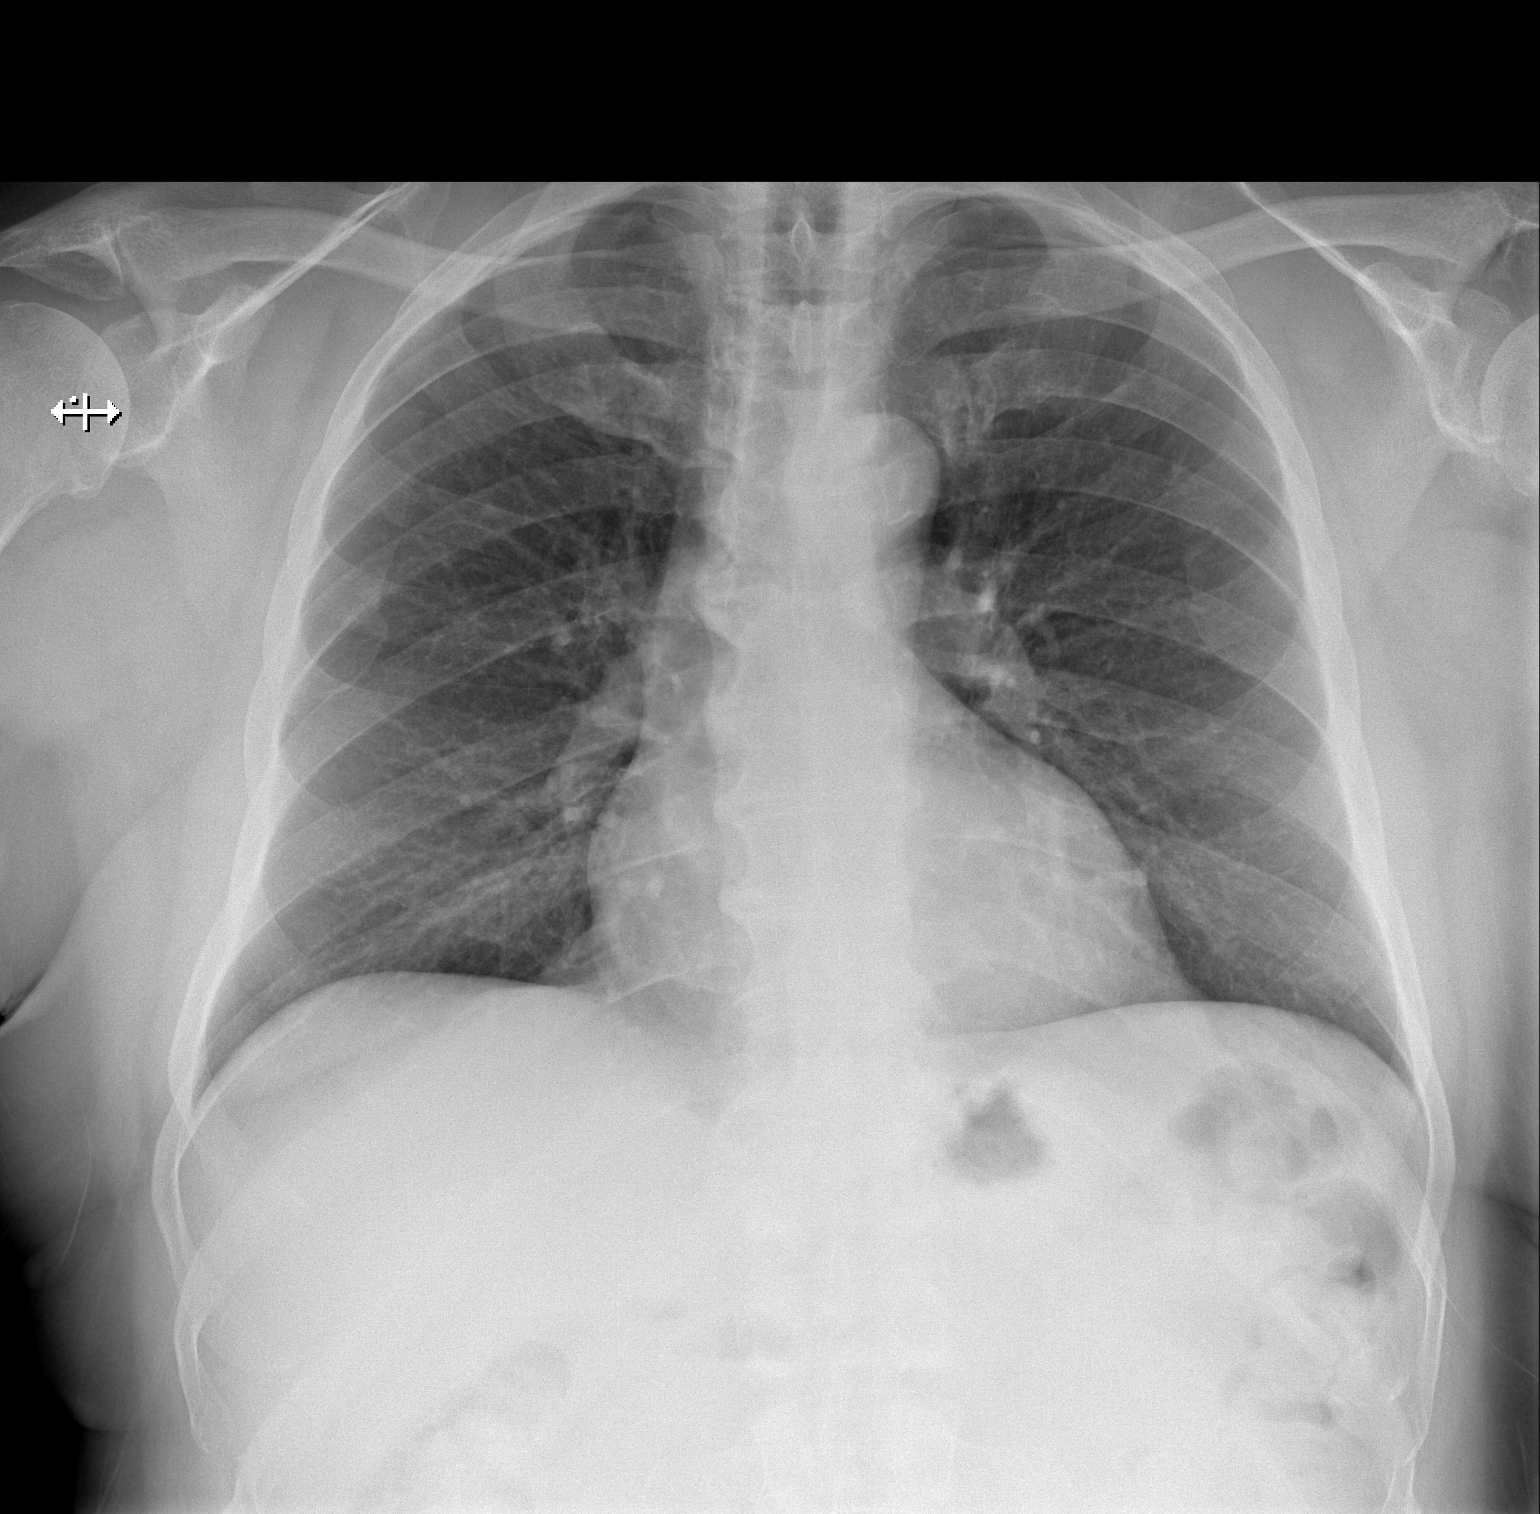

[w chest lat]
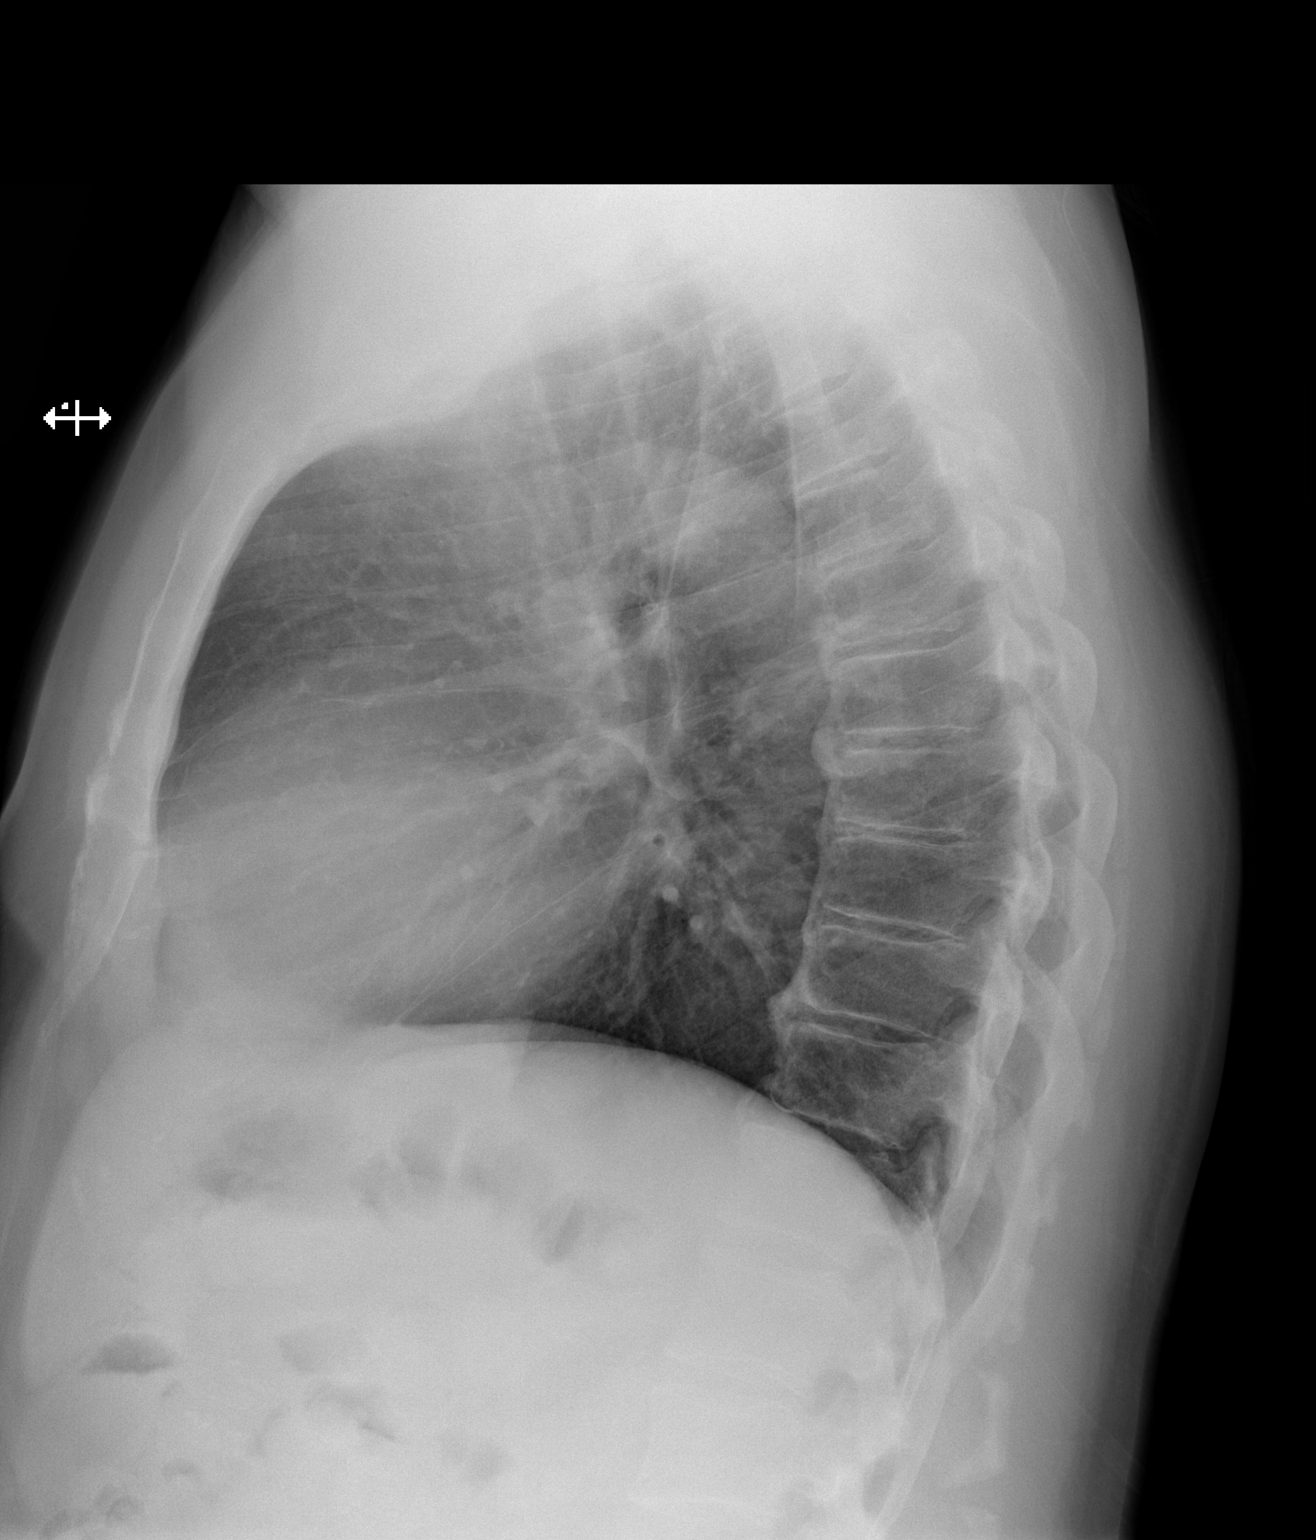

[2 of 2 positions shown; findings below may reference images not displayed]

FINDINGS: The lungs are well-expanded and clear. The heart and pulmonary
vascularity are normal. The mediastinum is normal in width. There is
calcification in the wall of the aortic arch. There is no pleural
effusion. There is calcification of a portion of the anterior
longitudinal ligament in the lower thoracic spine. There is mild
degenerative disc space narrowing at multiple thoracic levels.
IMPRESSION: There is no active cardiopulmonary disease.

Thoracic aortic atherosclerosis.

## 2019-06-01 IMAGING — CR DG LUMBAR SPINE 2-3V
2 series · 2 of 2 positions shown · non-contrast
Comparison: MRI of the lumbar spine February 08, 2017.

CLINICAL DATA: Localization radiographs intraoperatively.

EXAM:
LUMBAR SPINE - 2-3 VIEW

[xtable]
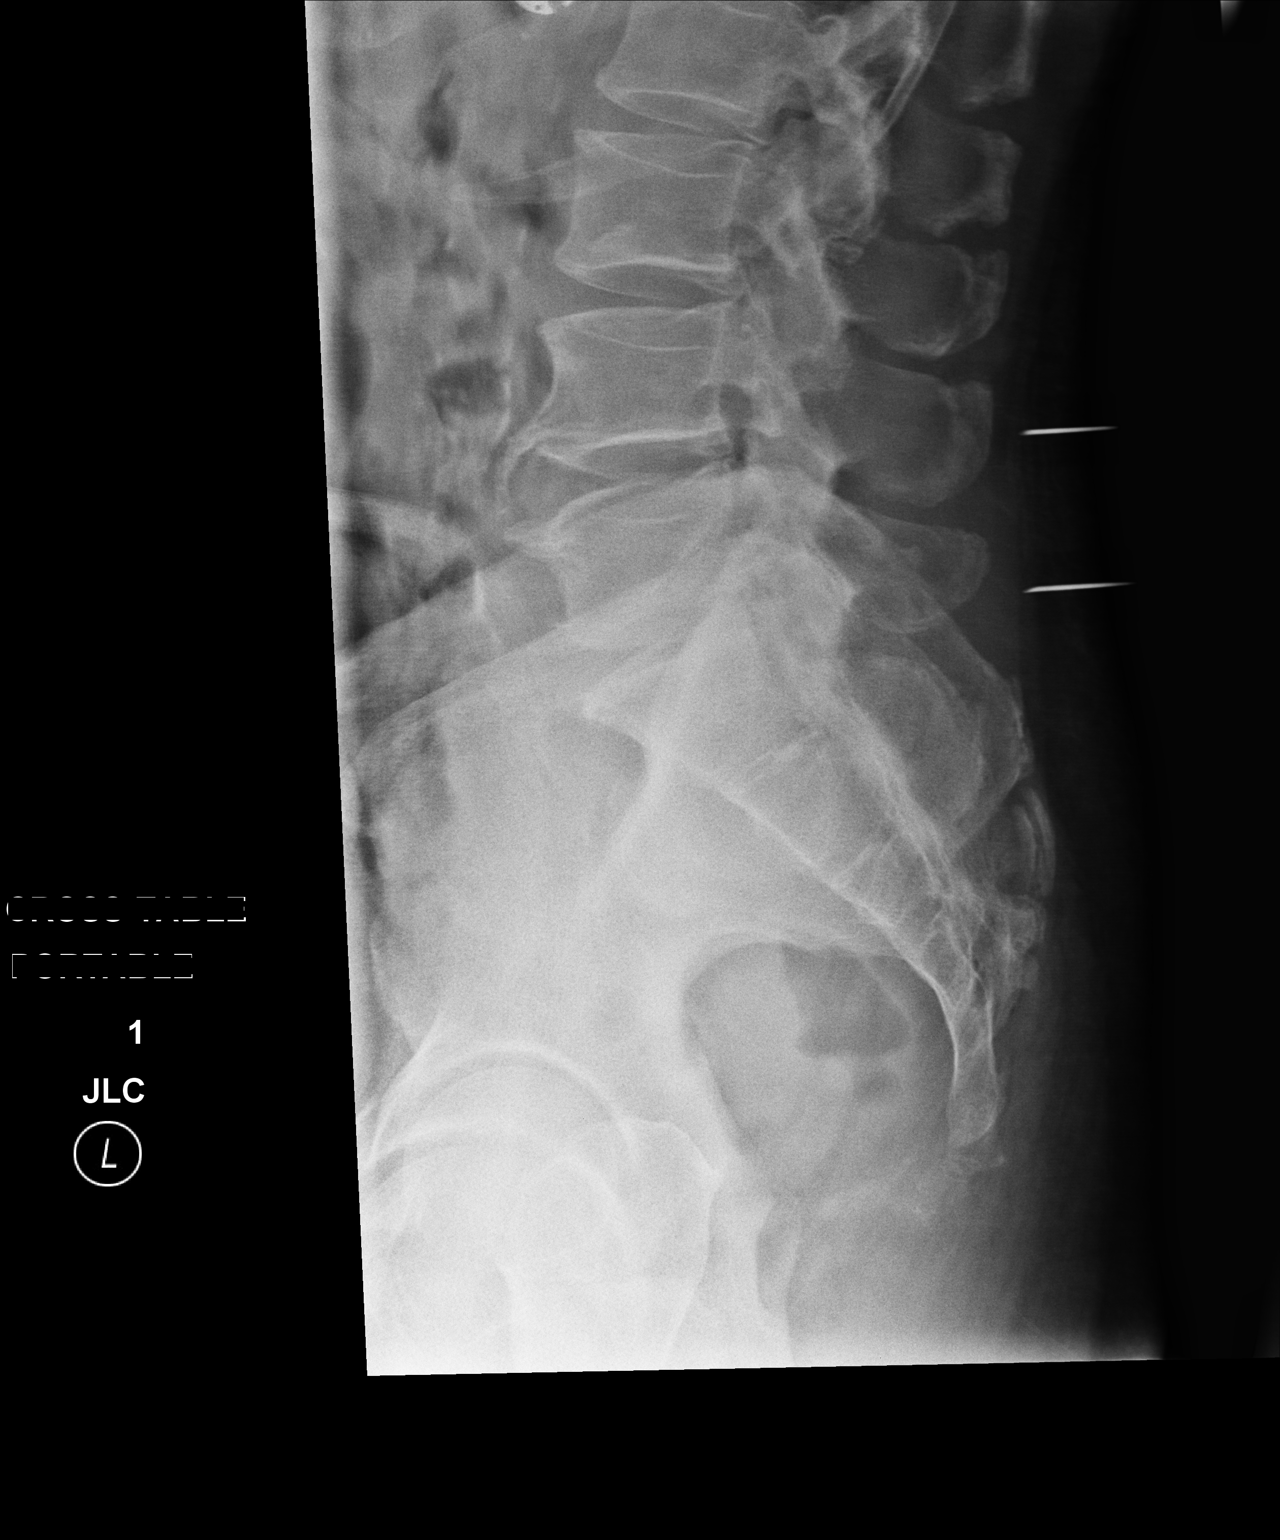

[lateral]
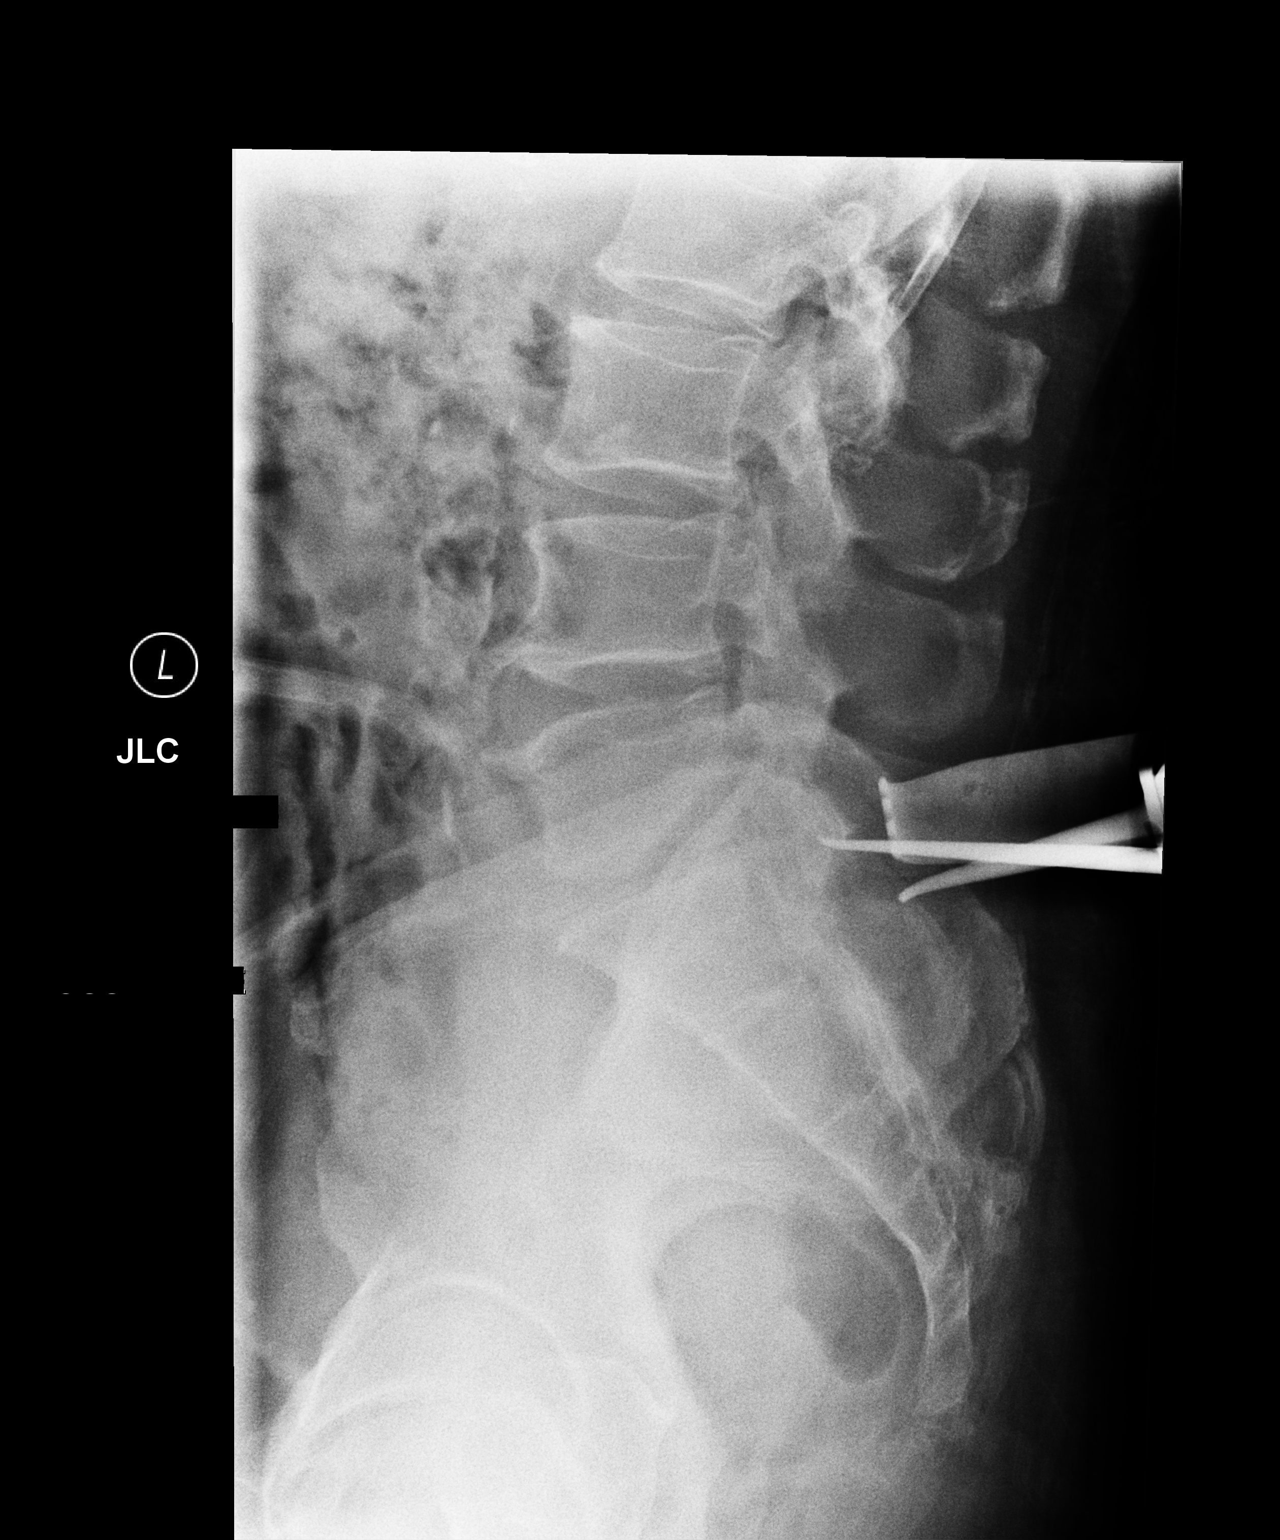

[2 of 2 positions shown; findings below may reference images not displayed]

FINDINGS: The image labeled film 1 exhibits metallic needles in the
subcutaneous soft tissues overlying the posterior aspects of the L4
and L5 spinous processes.

The image labeled film 2 exhibits a tissue spreader device overlying
the posterior aspect of the L5 spinous process. A metallic trocar
projects approximately 2 cm posterior to the posterior margin of the
L5-S1 disc. A second trocar lies more posteriorly and overlies the
posterior soft tissues at S1.
IMPRESSION: Lateral intraoperative localization radiographs with findings as
described.

## 2020-03-01 DIAGNOSIS — M47812 Spondylosis without myelopathy or radiculopathy, cervical region: Secondary | ICD-10-CM | POA: Diagnosis not present

## 2020-03-01 DIAGNOSIS — Z79891 Long term (current) use of opiate analgesic: Secondary | ICD-10-CM | POA: Diagnosis not present

## 2020-03-01 DIAGNOSIS — E1140 Type 2 diabetes mellitus with diabetic neuropathy, unspecified: Secondary | ICD-10-CM | POA: Diagnosis not present

## 2020-03-01 DIAGNOSIS — M5416 Radiculopathy, lumbar region: Secondary | ICD-10-CM | POA: Diagnosis not present

## 2020-03-01 DIAGNOSIS — M5136 Other intervertebral disc degeneration, lumbar region: Secondary | ICD-10-CM | POA: Diagnosis not present

## 2020-03-01 DIAGNOSIS — G894 Chronic pain syndrome: Secondary | ICD-10-CM | POA: Diagnosis not present

## 2020-03-01 DIAGNOSIS — M48061 Spinal stenosis, lumbar region without neurogenic claudication: Secondary | ICD-10-CM | POA: Diagnosis not present

## 2020-03-01 DIAGNOSIS — M109 Gout, unspecified: Secondary | ICD-10-CM | POA: Diagnosis not present

## 2020-03-01 DIAGNOSIS — M47816 Spondylosis without myelopathy or radiculopathy, lumbar region: Secondary | ICD-10-CM | POA: Diagnosis not present

## 2020-03-01 DIAGNOSIS — I10 Essential (primary) hypertension: Secondary | ICD-10-CM | POA: Diagnosis not present

## 2020-03-01 DIAGNOSIS — E7800 Pure hypercholesterolemia, unspecified: Secondary | ICD-10-CM | POA: Diagnosis not present

## 2020-03-02 DIAGNOSIS — M5136 Other intervertebral disc degeneration, lumbar region: Secondary | ICD-10-CM | POA: Diagnosis not present

## 2020-03-02 DIAGNOSIS — M47812 Spondylosis without myelopathy or radiculopathy, cervical region: Secondary | ICD-10-CM | POA: Diagnosis not present

## 2020-03-02 DIAGNOSIS — M5416 Radiculopathy, lumbar region: Secondary | ICD-10-CM | POA: Diagnosis not present

## 2020-03-02 DIAGNOSIS — Z79891 Long term (current) use of opiate analgesic: Secondary | ICD-10-CM | POA: Diagnosis not present

## 2020-03-02 DIAGNOSIS — M48061 Spinal stenosis, lumbar region without neurogenic claudication: Secondary | ICD-10-CM | POA: Diagnosis not present

## 2020-03-02 DIAGNOSIS — M47816 Spondylosis without myelopathy or radiculopathy, lumbar region: Secondary | ICD-10-CM | POA: Diagnosis not present

## 2020-03-04 DIAGNOSIS — I10 Essential (primary) hypertension: Secondary | ICD-10-CM | POA: Diagnosis not present

## 2020-03-04 DIAGNOSIS — E1140 Type 2 diabetes mellitus with diabetic neuropathy, unspecified: Secondary | ICD-10-CM | POA: Diagnosis not present

## 2020-03-04 DIAGNOSIS — Z79899 Other long term (current) drug therapy: Secondary | ICD-10-CM | POA: Diagnosis not present

## 2020-03-04 DIAGNOSIS — M109 Gout, unspecified: Secondary | ICD-10-CM | POA: Diagnosis not present

## 2020-03-04 DIAGNOSIS — Z7984 Long term (current) use of oral hypoglycemic drugs: Secondary | ICD-10-CM | POA: Diagnosis not present

## 2020-03-04 DIAGNOSIS — E7800 Pure hypercholesterolemia, unspecified: Secondary | ICD-10-CM | POA: Diagnosis not present

## 2020-03-22 DIAGNOSIS — H401132 Primary open-angle glaucoma, bilateral, moderate stage: Secondary | ICD-10-CM | POA: Diagnosis not present

## 2020-03-22 DIAGNOSIS — M47816 Spondylosis without myelopathy or radiculopathy, lumbar region: Secondary | ICD-10-CM | POA: Diagnosis not present

## 2020-03-22 DIAGNOSIS — I10 Essential (primary) hypertension: Secondary | ICD-10-CM | POA: Diagnosis not present

## 2020-03-22 DIAGNOSIS — E1140 Type 2 diabetes mellitus with diabetic neuropathy, unspecified: Secondary | ICD-10-CM | POA: Diagnosis not present

## 2020-03-22 DIAGNOSIS — M1990 Unspecified osteoarthritis, unspecified site: Secondary | ICD-10-CM | POA: Diagnosis not present

## 2020-03-22 DIAGNOSIS — E7800 Pure hypercholesterolemia, unspecified: Secondary | ICD-10-CM | POA: Diagnosis not present

## 2020-03-23 DIAGNOSIS — M1990 Unspecified osteoarthritis, unspecified site: Secondary | ICD-10-CM | POA: Diagnosis not present

## 2020-03-23 DIAGNOSIS — E1140 Type 2 diabetes mellitus with diabetic neuropathy, unspecified: Secondary | ICD-10-CM | POA: Diagnosis not present

## 2020-03-23 DIAGNOSIS — I10 Essential (primary) hypertension: Secondary | ICD-10-CM | POA: Diagnosis not present

## 2020-03-23 DIAGNOSIS — M47816 Spondylosis without myelopathy or radiculopathy, lumbar region: Secondary | ICD-10-CM | POA: Diagnosis not present

## 2020-03-23 DIAGNOSIS — E7800 Pure hypercholesterolemia, unspecified: Secondary | ICD-10-CM | POA: Diagnosis not present

## 2020-03-25 DIAGNOSIS — M5416 Radiculopathy, lumbar region: Secondary | ICD-10-CM | POA: Diagnosis not present

## 2020-03-25 DIAGNOSIS — M48061 Spinal stenosis, lumbar region without neurogenic claudication: Secondary | ICD-10-CM | POA: Diagnosis not present

## 2020-03-25 DIAGNOSIS — M47816 Spondylosis without myelopathy or radiculopathy, lumbar region: Secondary | ICD-10-CM | POA: Diagnosis not present

## 2020-03-25 DIAGNOSIS — M5136 Other intervertebral disc degeneration, lumbar region: Secondary | ICD-10-CM | POA: Diagnosis not present

## 2020-03-25 DIAGNOSIS — Z79891 Long term (current) use of opiate analgesic: Secondary | ICD-10-CM | POA: Diagnosis not present

## 2020-03-25 DIAGNOSIS — M47812 Spondylosis without myelopathy or radiculopathy, cervical region: Secondary | ICD-10-CM | POA: Diagnosis not present

## 2020-04-22 DIAGNOSIS — Z79891 Long term (current) use of opiate analgesic: Secondary | ICD-10-CM | POA: Diagnosis not present

## 2020-04-22 DIAGNOSIS — M5416 Radiculopathy, lumbar region: Secondary | ICD-10-CM | POA: Diagnosis not present

## 2020-04-22 DIAGNOSIS — M48061 Spinal stenosis, lumbar region without neurogenic claudication: Secondary | ICD-10-CM | POA: Diagnosis not present

## 2020-04-22 DIAGNOSIS — M5136 Other intervertebral disc degeneration, lumbar region: Secondary | ICD-10-CM | POA: Diagnosis not present

## 2020-04-22 DIAGNOSIS — M47812 Spondylosis without myelopathy or radiculopathy, cervical region: Secondary | ICD-10-CM | POA: Diagnosis not present

## 2020-04-22 DIAGNOSIS — M47816 Spondylosis without myelopathy or radiculopathy, lumbar region: Secondary | ICD-10-CM | POA: Diagnosis not present

## 2020-04-27 DIAGNOSIS — M545 Low back pain, unspecified: Secondary | ICD-10-CM | POA: Diagnosis not present

## 2020-04-27 DIAGNOSIS — M5416 Radiculopathy, lumbar region: Secondary | ICD-10-CM | POA: Diagnosis not present

## 2020-05-05 DIAGNOSIS — M961 Postlaminectomy syndrome, not elsewhere classified: Secondary | ICD-10-CM | POA: Diagnosis not present

## 2020-05-19 DIAGNOSIS — M1990 Unspecified osteoarthritis, unspecified site: Secondary | ICD-10-CM | POA: Diagnosis not present

## 2020-05-19 DIAGNOSIS — I10 Essential (primary) hypertension: Secondary | ICD-10-CM | POA: Diagnosis not present

## 2020-05-19 DIAGNOSIS — M47816 Spondylosis without myelopathy or radiculopathy, lumbar region: Secondary | ICD-10-CM | POA: Diagnosis not present

## 2020-05-19 DIAGNOSIS — E7800 Pure hypercholesterolemia, unspecified: Secondary | ICD-10-CM | POA: Diagnosis not present

## 2020-05-19 DIAGNOSIS — E1140 Type 2 diabetes mellitus with diabetic neuropathy, unspecified: Secondary | ICD-10-CM | POA: Diagnosis not present

## 2020-05-20 DIAGNOSIS — M47816 Spondylosis without myelopathy or radiculopathy, lumbar region: Secondary | ICD-10-CM | POA: Diagnosis not present

## 2020-05-20 DIAGNOSIS — M5416 Radiculopathy, lumbar region: Secondary | ICD-10-CM | POA: Diagnosis not present

## 2020-05-20 DIAGNOSIS — M47812 Spondylosis without myelopathy or radiculopathy, cervical region: Secondary | ICD-10-CM | POA: Diagnosis not present

## 2020-05-20 DIAGNOSIS — M48061 Spinal stenosis, lumbar region without neurogenic claudication: Secondary | ICD-10-CM | POA: Diagnosis not present

## 2020-05-20 DIAGNOSIS — Z79891 Long term (current) use of opiate analgesic: Secondary | ICD-10-CM | POA: Diagnosis not present

## 2020-05-20 DIAGNOSIS — M5136 Other intervertebral disc degeneration, lumbar region: Secondary | ICD-10-CM | POA: Diagnosis not present

## 2020-06-09 DIAGNOSIS — Z0000 Encounter for general adult medical examination without abnormal findings: Secondary | ICD-10-CM | POA: Diagnosis not present

## 2020-06-16 DIAGNOSIS — M961 Postlaminectomy syndrome, not elsewhere classified: Secondary | ICD-10-CM | POA: Diagnosis not present

## 2020-06-16 DIAGNOSIS — M5416 Radiculopathy, lumbar region: Secondary | ICD-10-CM | POA: Diagnosis not present

## 2020-06-18 DIAGNOSIS — M1990 Unspecified osteoarthritis, unspecified site: Secondary | ICD-10-CM | POA: Diagnosis not present

## 2020-06-18 DIAGNOSIS — E1140 Type 2 diabetes mellitus with diabetic neuropathy, unspecified: Secondary | ICD-10-CM | POA: Diagnosis not present

## 2020-06-18 DIAGNOSIS — I10 Essential (primary) hypertension: Secondary | ICD-10-CM | POA: Diagnosis not present

## 2020-06-18 DIAGNOSIS — M47816 Spondylosis without myelopathy or radiculopathy, lumbar region: Secondary | ICD-10-CM | POA: Diagnosis not present

## 2020-06-18 DIAGNOSIS — E7800 Pure hypercholesterolemia, unspecified: Secondary | ICD-10-CM | POA: Diagnosis not present

## 2020-06-21 DIAGNOSIS — M5136 Other intervertebral disc degeneration, lumbar region: Secondary | ICD-10-CM | POA: Diagnosis not present

## 2020-06-21 DIAGNOSIS — M5416 Radiculopathy, lumbar region: Secondary | ICD-10-CM | POA: Diagnosis not present

## 2020-06-21 DIAGNOSIS — M48061 Spinal stenosis, lumbar region without neurogenic claudication: Secondary | ICD-10-CM | POA: Diagnosis not present

## 2020-06-21 DIAGNOSIS — M47812 Spondylosis without myelopathy or radiculopathy, cervical region: Secondary | ICD-10-CM | POA: Diagnosis not present

## 2020-06-21 DIAGNOSIS — M47816 Spondylosis without myelopathy or radiculopathy, lumbar region: Secondary | ICD-10-CM | POA: Diagnosis not present

## 2020-06-21 DIAGNOSIS — Z79891 Long term (current) use of opiate analgesic: Secondary | ICD-10-CM | POA: Diagnosis not present

## 2020-07-08 DIAGNOSIS — Z7984 Long term (current) use of oral hypoglycemic drugs: Secondary | ICD-10-CM | POA: Diagnosis not present

## 2020-07-08 DIAGNOSIS — M47816 Spondylosis without myelopathy or radiculopathy, lumbar region: Secondary | ICD-10-CM | POA: Diagnosis not present

## 2020-07-08 DIAGNOSIS — E1140 Type 2 diabetes mellitus with diabetic neuropathy, unspecified: Secondary | ICD-10-CM | POA: Diagnosis not present

## 2020-07-08 DIAGNOSIS — I10 Essential (primary) hypertension: Secondary | ICD-10-CM | POA: Diagnosis not present

## 2020-07-08 DIAGNOSIS — M109 Gout, unspecified: Secondary | ICD-10-CM | POA: Diagnosis not present

## 2020-07-08 DIAGNOSIS — E7800 Pure hypercholesterolemia, unspecified: Secondary | ICD-10-CM | POA: Diagnosis not present

## 2020-07-20 DIAGNOSIS — M48061 Spinal stenosis, lumbar region without neurogenic claudication: Secondary | ICD-10-CM | POA: Diagnosis not present

## 2020-07-20 DIAGNOSIS — M47812 Spondylosis without myelopathy or radiculopathy, cervical region: Secondary | ICD-10-CM | POA: Diagnosis not present

## 2020-07-20 DIAGNOSIS — E1140 Type 2 diabetes mellitus with diabetic neuropathy, unspecified: Secondary | ICD-10-CM | POA: Diagnosis not present

## 2020-07-20 DIAGNOSIS — E7800 Pure hypercholesterolemia, unspecified: Secondary | ICD-10-CM | POA: Diagnosis not present

## 2020-07-20 DIAGNOSIS — M1990 Unspecified osteoarthritis, unspecified site: Secondary | ICD-10-CM | POA: Diagnosis not present

## 2020-07-20 DIAGNOSIS — Z79891 Long term (current) use of opiate analgesic: Secondary | ICD-10-CM | POA: Diagnosis not present

## 2020-07-20 DIAGNOSIS — M47816 Spondylosis without myelopathy or radiculopathy, lumbar region: Secondary | ICD-10-CM | POA: Diagnosis not present

## 2020-07-20 DIAGNOSIS — I10 Essential (primary) hypertension: Secondary | ICD-10-CM | POA: Diagnosis not present

## 2020-07-20 DIAGNOSIS — M5136 Other intervertebral disc degeneration, lumbar region: Secondary | ICD-10-CM | POA: Diagnosis not present

## 2020-08-05 DIAGNOSIS — M1990 Unspecified osteoarthritis, unspecified site: Secondary | ICD-10-CM | POA: Diagnosis not present

## 2020-08-05 DIAGNOSIS — M47816 Spondylosis without myelopathy or radiculopathy, lumbar region: Secondary | ICD-10-CM | POA: Diagnosis not present

## 2020-08-05 DIAGNOSIS — E7800 Pure hypercholesterolemia, unspecified: Secondary | ICD-10-CM | POA: Diagnosis not present

## 2020-08-05 DIAGNOSIS — I10 Essential (primary) hypertension: Secondary | ICD-10-CM | POA: Diagnosis not present

## 2020-08-05 DIAGNOSIS — E1140 Type 2 diabetes mellitus with diabetic neuropathy, unspecified: Secondary | ICD-10-CM | POA: Diagnosis not present

## 2020-08-17 DIAGNOSIS — Z79891 Long term (current) use of opiate analgesic: Secondary | ICD-10-CM | POA: Diagnosis not present

## 2020-08-17 DIAGNOSIS — M47812 Spondylosis without myelopathy or radiculopathy, cervical region: Secondary | ICD-10-CM | POA: Diagnosis not present

## 2020-08-17 DIAGNOSIS — M47816 Spondylosis without myelopathy or radiculopathy, lumbar region: Secondary | ICD-10-CM | POA: Diagnosis not present

## 2020-08-17 DIAGNOSIS — M5136 Other intervertebral disc degeneration, lumbar region: Secondary | ICD-10-CM | POA: Diagnosis not present

## 2020-08-17 DIAGNOSIS — M48061 Spinal stenosis, lumbar region without neurogenic claudication: Secondary | ICD-10-CM | POA: Diagnosis not present

## 2020-09-08 DIAGNOSIS — E7800 Pure hypercholesterolemia, unspecified: Secondary | ICD-10-CM | POA: Diagnosis not present

## 2020-09-08 DIAGNOSIS — E1140 Type 2 diabetes mellitus with diabetic neuropathy, unspecified: Secondary | ICD-10-CM | POA: Diagnosis not present

## 2020-09-08 DIAGNOSIS — M47816 Spondylosis without myelopathy or radiculopathy, lumbar region: Secondary | ICD-10-CM | POA: Diagnosis not present

## 2020-09-08 DIAGNOSIS — M1990 Unspecified osteoarthritis, unspecified site: Secondary | ICD-10-CM | POA: Diagnosis not present

## 2020-09-08 DIAGNOSIS — I10 Essential (primary) hypertension: Secondary | ICD-10-CM | POA: Diagnosis not present

## 2020-09-16 DIAGNOSIS — M47812 Spondylosis without myelopathy or radiculopathy, cervical region: Secondary | ICD-10-CM | POA: Diagnosis not present

## 2020-09-16 DIAGNOSIS — M5136 Other intervertebral disc degeneration, lumbar region: Secondary | ICD-10-CM | POA: Diagnosis not present

## 2020-09-16 DIAGNOSIS — M48061 Spinal stenosis, lumbar region without neurogenic claudication: Secondary | ICD-10-CM | POA: Diagnosis not present

## 2020-09-16 DIAGNOSIS — M47816 Spondylosis without myelopathy or radiculopathy, lumbar region: Secondary | ICD-10-CM | POA: Diagnosis not present

## 2020-09-16 DIAGNOSIS — M5416 Radiculopathy, lumbar region: Secondary | ICD-10-CM | POA: Diagnosis not present

## 2020-09-16 DIAGNOSIS — G894 Chronic pain syndrome: Secondary | ICD-10-CM | POA: Diagnosis not present

## 2020-09-16 DIAGNOSIS — Z79891 Long term (current) use of opiate analgesic: Secondary | ICD-10-CM | POA: Diagnosis not present

## 2020-09-16 DIAGNOSIS — Z1389 Encounter for screening for other disorder: Secondary | ICD-10-CM | POA: Diagnosis not present

## 2020-09-20 DIAGNOSIS — H401121 Primary open-angle glaucoma, left eye, mild stage: Secondary | ICD-10-CM | POA: Diagnosis not present

## 2020-10-05 DIAGNOSIS — M47816 Spondylosis without myelopathy or radiculopathy, lumbar region: Secondary | ICD-10-CM | POA: Diagnosis not present

## 2020-10-05 DIAGNOSIS — E7800 Pure hypercholesterolemia, unspecified: Secondary | ICD-10-CM | POA: Diagnosis not present

## 2020-10-05 DIAGNOSIS — M1990 Unspecified osteoarthritis, unspecified site: Secondary | ICD-10-CM | POA: Diagnosis not present

## 2020-10-05 DIAGNOSIS — I10 Essential (primary) hypertension: Secondary | ICD-10-CM | POA: Diagnosis not present

## 2020-10-05 DIAGNOSIS — E1140 Type 2 diabetes mellitus with diabetic neuropathy, unspecified: Secondary | ICD-10-CM | POA: Diagnosis not present

## 2020-10-15 DIAGNOSIS — Z79891 Long term (current) use of opiate analgesic: Secondary | ICD-10-CM | POA: Diagnosis not present

## 2020-10-15 DIAGNOSIS — M47816 Spondylosis without myelopathy or radiculopathy, lumbar region: Secondary | ICD-10-CM | POA: Diagnosis not present

## 2020-10-15 DIAGNOSIS — M5416 Radiculopathy, lumbar region: Secondary | ICD-10-CM | POA: Diagnosis not present

## 2020-10-15 DIAGNOSIS — M5136 Other intervertebral disc degeneration, lumbar region: Secondary | ICD-10-CM | POA: Diagnosis not present

## 2020-10-15 DIAGNOSIS — M48061 Spinal stenosis, lumbar region without neurogenic claudication: Secondary | ICD-10-CM | POA: Diagnosis not present

## 2020-10-15 DIAGNOSIS — M47812 Spondylosis without myelopathy or radiculopathy, cervical region: Secondary | ICD-10-CM | POA: Diagnosis not present

## 2020-11-04 DIAGNOSIS — M1990 Unspecified osteoarthritis, unspecified site: Secondary | ICD-10-CM | POA: Diagnosis not present

## 2020-11-04 DIAGNOSIS — E7800 Pure hypercholesterolemia, unspecified: Secondary | ICD-10-CM | POA: Diagnosis not present

## 2020-11-04 DIAGNOSIS — I10 Essential (primary) hypertension: Secondary | ICD-10-CM | POA: Diagnosis not present

## 2020-11-04 DIAGNOSIS — E1140 Type 2 diabetes mellitus with diabetic neuropathy, unspecified: Secondary | ICD-10-CM | POA: Diagnosis not present

## 2020-11-04 DIAGNOSIS — M47816 Spondylosis without myelopathy or radiculopathy, lumbar region: Secondary | ICD-10-CM | POA: Diagnosis not present

## 2020-11-12 DIAGNOSIS — M5136 Other intervertebral disc degeneration, lumbar region: Secondary | ICD-10-CM | POA: Diagnosis not present

## 2020-11-12 DIAGNOSIS — M5416 Radiculopathy, lumbar region: Secondary | ICD-10-CM | POA: Diagnosis not present

## 2020-11-12 DIAGNOSIS — M48061 Spinal stenosis, lumbar region without neurogenic claudication: Secondary | ICD-10-CM | POA: Diagnosis not present

## 2020-11-12 DIAGNOSIS — M47812 Spondylosis without myelopathy or radiculopathy, cervical region: Secondary | ICD-10-CM | POA: Diagnosis not present

## 2020-11-12 DIAGNOSIS — M47816 Spondylosis without myelopathy or radiculopathy, lumbar region: Secondary | ICD-10-CM | POA: Diagnosis not present

## 2020-11-12 DIAGNOSIS — Z79891 Long term (current) use of opiate analgesic: Secondary | ICD-10-CM | POA: Diagnosis not present

## 2020-12-06 DIAGNOSIS — E7800 Pure hypercholesterolemia, unspecified: Secondary | ICD-10-CM | POA: Diagnosis not present

## 2020-12-06 DIAGNOSIS — I10 Essential (primary) hypertension: Secondary | ICD-10-CM | POA: Diagnosis not present

## 2020-12-06 DIAGNOSIS — M1990 Unspecified osteoarthritis, unspecified site: Secondary | ICD-10-CM | POA: Diagnosis not present

## 2020-12-06 DIAGNOSIS — E1140 Type 2 diabetes mellitus with diabetic neuropathy, unspecified: Secondary | ICD-10-CM | POA: Diagnosis not present

## 2020-12-10 DIAGNOSIS — M5416 Radiculopathy, lumbar region: Secondary | ICD-10-CM | POA: Diagnosis not present

## 2020-12-10 DIAGNOSIS — M5136 Other intervertebral disc degeneration, lumbar region: Secondary | ICD-10-CM | POA: Diagnosis not present

## 2020-12-10 DIAGNOSIS — M47812 Spondylosis without myelopathy or radiculopathy, cervical region: Secondary | ICD-10-CM | POA: Diagnosis not present

## 2020-12-10 DIAGNOSIS — Z79891 Long term (current) use of opiate analgesic: Secondary | ICD-10-CM | POA: Diagnosis not present

## 2020-12-10 DIAGNOSIS — M48061 Spinal stenosis, lumbar region without neurogenic claudication: Secondary | ICD-10-CM | POA: Diagnosis not present

## 2020-12-10 DIAGNOSIS — M47816 Spondylosis without myelopathy or radiculopathy, lumbar region: Secondary | ICD-10-CM | POA: Diagnosis not present

## 2021-01-03 DIAGNOSIS — M1990 Unspecified osteoarthritis, unspecified site: Secondary | ICD-10-CM | POA: Diagnosis not present

## 2021-01-03 DIAGNOSIS — M47816 Spondylosis without myelopathy or radiculopathy, lumbar region: Secondary | ICD-10-CM | POA: Diagnosis not present

## 2021-01-03 DIAGNOSIS — I10 Essential (primary) hypertension: Secondary | ICD-10-CM | POA: Diagnosis not present

## 2021-01-03 DIAGNOSIS — E7800 Pure hypercholesterolemia, unspecified: Secondary | ICD-10-CM | POA: Diagnosis not present

## 2021-01-03 DIAGNOSIS — E1140 Type 2 diabetes mellitus with diabetic neuropathy, unspecified: Secondary | ICD-10-CM | POA: Diagnosis not present

## 2021-01-07 DIAGNOSIS — M5136 Other intervertebral disc degeneration, lumbar region: Secondary | ICD-10-CM | POA: Diagnosis not present

## 2021-01-07 DIAGNOSIS — M47812 Spondylosis without myelopathy or radiculopathy, cervical region: Secondary | ICD-10-CM | POA: Diagnosis not present

## 2021-01-07 DIAGNOSIS — Z79891 Long term (current) use of opiate analgesic: Secondary | ICD-10-CM | POA: Diagnosis not present

## 2021-01-07 DIAGNOSIS — M5416 Radiculopathy, lumbar region: Secondary | ICD-10-CM | POA: Diagnosis not present

## 2021-01-07 DIAGNOSIS — M47816 Spondylosis without myelopathy or radiculopathy, lumbar region: Secondary | ICD-10-CM | POA: Diagnosis not present

## 2021-01-07 DIAGNOSIS — M48061 Spinal stenosis, lumbar region without neurogenic claudication: Secondary | ICD-10-CM | POA: Diagnosis not present

## 2021-01-18 DIAGNOSIS — M1990 Unspecified osteoarthritis, unspecified site: Secondary | ICD-10-CM | POA: Diagnosis not present

## 2021-01-18 DIAGNOSIS — M109 Gout, unspecified: Secondary | ICD-10-CM | POA: Diagnosis not present

## 2021-01-18 DIAGNOSIS — Z7984 Long term (current) use of oral hypoglycemic drugs: Secondary | ICD-10-CM | POA: Diagnosis not present

## 2021-01-18 DIAGNOSIS — E1140 Type 2 diabetes mellitus with diabetic neuropathy, unspecified: Secondary | ICD-10-CM | POA: Diagnosis not present

## 2021-01-18 DIAGNOSIS — E7800 Pure hypercholesterolemia, unspecified: Secondary | ICD-10-CM | POA: Diagnosis not present

## 2021-01-18 DIAGNOSIS — I10 Essential (primary) hypertension: Secondary | ICD-10-CM | POA: Diagnosis not present

## 2021-01-18 DIAGNOSIS — R350 Frequency of micturition: Secondary | ICD-10-CM | POA: Diagnosis not present

## 2021-01-26 DIAGNOSIS — M25552 Pain in left hip: Secondary | ICD-10-CM | POA: Diagnosis not present

## 2021-02-01 DIAGNOSIS — M47816 Spondylosis without myelopathy or radiculopathy, lumbar region: Secondary | ICD-10-CM | POA: Diagnosis not present

## 2021-02-01 DIAGNOSIS — E1140 Type 2 diabetes mellitus with diabetic neuropathy, unspecified: Secondary | ICD-10-CM | POA: Diagnosis not present

## 2021-02-01 DIAGNOSIS — M1990 Unspecified osteoarthritis, unspecified site: Secondary | ICD-10-CM | POA: Diagnosis not present

## 2021-02-01 DIAGNOSIS — I10 Essential (primary) hypertension: Secondary | ICD-10-CM | POA: Diagnosis not present

## 2021-02-01 DIAGNOSIS — E7800 Pure hypercholesterolemia, unspecified: Secondary | ICD-10-CM | POA: Diagnosis not present

## 2021-02-04 DIAGNOSIS — M48061 Spinal stenosis, lumbar region without neurogenic claudication: Secondary | ICD-10-CM | POA: Diagnosis not present

## 2021-02-04 DIAGNOSIS — Z79891 Long term (current) use of opiate analgesic: Secondary | ICD-10-CM | POA: Diagnosis not present

## 2021-02-04 DIAGNOSIS — M47812 Spondylosis without myelopathy or radiculopathy, cervical region: Secondary | ICD-10-CM | POA: Diagnosis not present

## 2021-02-04 DIAGNOSIS — M5136 Other intervertebral disc degeneration, lumbar region: Secondary | ICD-10-CM | POA: Diagnosis not present

## 2021-02-04 DIAGNOSIS — M47816 Spondylosis without myelopathy or radiculopathy, lumbar region: Secondary | ICD-10-CM | POA: Diagnosis not present

## 2021-02-04 DIAGNOSIS — M5416 Radiculopathy, lumbar region: Secondary | ICD-10-CM | POA: Diagnosis not present

## 2021-02-11 DIAGNOSIS — M545 Low back pain, unspecified: Secondary | ICD-10-CM | POA: Diagnosis not present

## 2021-02-11 DIAGNOSIS — M5416 Radiculopathy, lumbar region: Secondary | ICD-10-CM | POA: Diagnosis not present

## 2021-02-11 DIAGNOSIS — M5117 Intervertebral disc disorders with radiculopathy, lumbosacral region: Secondary | ICD-10-CM | POA: Diagnosis not present

## 2021-02-18 DIAGNOSIS — M5416 Radiculopathy, lumbar region: Secondary | ICD-10-CM | POA: Diagnosis not present

## 2021-03-01 ENCOUNTER — Other Ambulatory Visit: Payer: Self-pay | Admitting: Orthopedic Surgery

## 2021-03-04 DIAGNOSIS — M5136 Other intervertebral disc degeneration, lumbar region: Secondary | ICD-10-CM | POA: Diagnosis not present

## 2021-03-04 DIAGNOSIS — M5416 Radiculopathy, lumbar region: Secondary | ICD-10-CM | POA: Diagnosis not present

## 2021-03-04 DIAGNOSIS — M47812 Spondylosis without myelopathy or radiculopathy, cervical region: Secondary | ICD-10-CM | POA: Diagnosis not present

## 2021-03-04 DIAGNOSIS — M47816 Spondylosis without myelopathy or radiculopathy, lumbar region: Secondary | ICD-10-CM | POA: Diagnosis not present

## 2021-03-04 DIAGNOSIS — M48061 Spinal stenosis, lumbar region without neurogenic claudication: Secondary | ICD-10-CM | POA: Diagnosis not present

## 2021-03-04 DIAGNOSIS — Z79891 Long term (current) use of opiate analgesic: Secondary | ICD-10-CM | POA: Diagnosis not present

## 2021-03-04 NOTE — Pre-Procedure Instructions (Signed)
Surgical Instructions    Your procedure is scheduled on Thursday, March 10, 2021 at 7:30 AM.  Report to [facility] Main Entrance "A" at 5:30 AM A.M., then check in with the Admitting office.  Call this number if you have problems the morning of surgery:  [phone]   If you have any questions prior to your surgery date call [phone]: Open Monday-Friday 8am-4pm    Remember:  Do not eat after midnight the night before your surgery  You may drink clear liquids until 4:30 AM the morning of your surgery.   Clear liquids allowed are: Water, Non-Citrus Juices (without pulp), Carbonated Beverages, Clear Tea, Black Coffee Only, and Gatorade  Please complete your G2 that was provided to you by 4:30 AM the morning of surgery.  Please, if able, drink it in one setting. DO NOT SIP.     Take these medicines the morning of surgery with A SIP OF WATER:  allopurinol (ZYLOPRIM) amLODipine (NORVASC) baclofen (LIORESAL) metoprolol tartrate (LOPRESSOR) rosuvastatin (CRESTOR) oxyCODONE-acetaminophen (PERCOCET) - if needed   As of today, STOP taking any Aspirin (unless otherwise instructed by your surgeon) Aleve, Naproxen, Ibuprofen, Motrin, Advil, Goody's, BC's, all herbal medications, fish oil, and all vitamins.  WHAT DO I DO ABOUT MY DIABETES MEDICATION?   Do not take metFORMIN (GLUCOPHAGE) the morning of surgery.   HOW TO MANAGE YOUR DIABETES BEFORE AND AFTER SURGERY  Why is it important to control my blood sugar before and after surgery? Improving blood sugar levels before and after surgery helps healing and can limit problems. A way of improving blood sugar control is eating a healthy diet by:  Eating less sugar and carbohydrates  Increasing activity/exercise  Talking with your doctor about reaching your blood sugar goals High blood sugars (greater than 180 mg/dL) can raise your risk of infections and slow your recovery, so you will need to focus on controlling your diabetes  during the weeks before surgery. Make sure that the doctor who takes care of your diabetes knows about your planned surgery including the date and location.  How do I manage my blood sugar before surgery? Check your blood sugar at least 4 times a day, starting 2 days before surgery, to make sure that the level is not too high or low.  Check your blood sugar the morning of your surgery when you wake up and every 2 hours until you get to the Short Stay unit.  If your blood sugar is less than 70 mg/dL, you will need to treat for low blood sugar: Do not take insulin. Treat a low blood sugar (less than 70 mg/dL) with  cup of clear juice (cranberry or apple), 4 glucose tablets, OR glucose gel. Recheck blood sugar in 15 minutes after treatment (to make sure it is greater than 70 mg/dL). If your blood sugar is not greater than 70 mg/dL on recheck, call [phone] for further instructions. Report your blood sugar to the short stay nurse when you get to Short Stay.  If you are admitted to the hospital after surgery: Your blood sugar will be checked by the staff and you will probably be given insulin after surgery (instead of oral diabetes medicines) to make sure you have good blood sugar levels. The goal for blood sugar control after surgery is 80-180 mg/dL.                       Do NOT Smoke (Tobacco/Vaping) or drink Alcohol 24 hours prior to your  procedure.  If you use a CPAP at night, you may bring all equipment for your overnight stay.   Contacts, glasses, piercing's, hearing aid's, dentures or partials may not be worn into surgery, please bring cases for these belongings.    For patients admitted to the hospital, discharge time will be determined by your treatment team.   Patients discharged the day of surgery will not be allowed to drive home, and someone needs to stay with them for 24 hours.  NO VISITORS WILL BE ALLOWED IN PRE-OP WHERE PATIENTS GET READY FOR SURGERY.  ONLY 1 SUPPORT  PERSON MAY BE PRESENT IN THE WAITING ROOM WHILE YOU ARE IN SURGERY.  IF YOU ARE TO BE ADMITTED, ONCE YOU ARE IN YOUR ROOM YOU WILL BE ALLOWED TWO (2) VISITORS.  Minor children may have two parents present. Special consideration for safety and communication needs will be reviewed on a case by case basis.   Special instructions:   [place]- Preparing For Surgery  Before surgery, you can play an important role. Because skin is not sterile, your skin needs to be as free of germs as possible. You can reduce the number of germs on your skin by washing with CHG (chlorahexidine gluconate) Soap before surgery.  CHG is an antiseptic cleaner which kills germs and bonds with the skin to continue killing germs even after washing.    Oral Hygiene is also important to reduce your risk of infection.  Remember - BRUSH YOUR TEETH THE MORNING OF SURGERY WITH YOUR REGULAR TOOTHPASTE  Please do not use if you have an allergy to CHG or antibacterial soaps. If your skin becomes reddened/irritated stop using the CHG.  Do not shave (including legs and underarms) for at least 48 hours prior to first CHG shower. It is OK to shave your face.  Please follow these instructions carefully.   Shower the NIGHT BEFORE SURGERY and the MORNING OF SURGERY  If you chose to wash your hair, wash your hair first as usual with your normal shampoo.  After you shampoo, rinse your hair and body thoroughly to remove the shampoo.  Use CHG Soap as you would any other liquid soap. You can apply CHG directly to the skin and wash gently with a scrungie or a clean washcloth.   Apply the CHG Soap to your body ONLY FROM THE NECK DOWN.  Do not use on open wounds or open sores. Avoid contact with your eyes, ears, mouth and genitals (private parts). Wash Face and genitals (private parts)  with your normal soap.   Wash thoroughly, paying special attention to the area where your surgery will be performed.  Thoroughly rinse your body with warm  water from the neck down.  DO NOT shower/wash with your normal soap after using and rinsing off the CHG Soap.  Pat yourself dry with a CLEAN TOWEL.  Wear CLEAN PAJAMAS to bed the night before surgery  Place CLEAN SHEETS on your bed the night before your surgery  DO NOT SLEEP WITH PETS.   Day of Surgery: Shower with CHG soap. Do not wear jewelry. Do not wear lotions, powders, colognes, or deodorant. Do not shave 48 hours prior to surgery.  Men may shave face and neck. Do not bring valuables to the hospital. [facility] is not responsible for any belongings or valuables. Wear Clean/Comfortable clothing the morning of surgery Remember to brush your teeth WITH YOUR REGULAR TOOTHPASTE.   Please read over the following fact sheets that you were given.  3 days prior to your procedure or After your COVID test   You are not required to quarantine however you are required to wear a well-fitting mask when you are out and around people not in your household. If your mask becomes wet or soiled, replace with a new one.   Wash your hands often with soap and water for 20 seconds or clean your hands with an alcohol-based hand sanitizer that contains at least 60% alcohol.   Do not share personal items.   Notify your provider:  o if you are in close contact with someone who has COVID  o or if you develop a fever of 100.4 or greater, sneezing, cough, sore throat, shortness of breath or body aches.

## 2021-03-07 ENCOUNTER — Encounter (HOSPITAL_COMMUNITY)
Admission: RE | Admit: 2021-03-07 | Discharge: 2021-03-07 | Disposition: A | Discharge status: Home / Self Care | Payer: Medicare Other | Source: Ambulatory Visit | Attending: Orthopedic Surgery | Admitting: Orthopedic Surgery

## 2021-03-07 ENCOUNTER — Encounter (HOSPITAL_COMMUNITY): Payer: Self-pay

## 2021-03-07 ENCOUNTER — Other Ambulatory Visit: Payer: Self-pay

## 2021-03-07 VITALS — BP 134/86 | HR 86 | Temp 98.2°F | Resp 18 | Ht 67.0 in | Wt 204.8 lb

## 2021-03-07 DIAGNOSIS — M4326 Fusion of spine, lumbar region: Secondary | ICD-10-CM | POA: Diagnosis not present

## 2021-03-07 DIAGNOSIS — I10 Essential (primary) hypertension: Secondary | ICD-10-CM

## 2021-03-07 DIAGNOSIS — Z79899 Other long term (current) drug therapy: Secondary | ICD-10-CM | POA: Diagnosis not present

## 2021-03-07 DIAGNOSIS — M4327 Fusion of spine, lumbosacral region: Secondary | ICD-10-CM | POA: Diagnosis not present

## 2021-03-07 DIAGNOSIS — Z888 Allergy status to other drugs, medicaments and biological substances status: Secondary | ICD-10-CM | POA: Diagnosis not present

## 2021-03-07 DIAGNOSIS — Z7984 Long term (current) use of oral hypoglycemic drugs: Secondary | ICD-10-CM | POA: Diagnosis not present

## 2021-03-07 DIAGNOSIS — M47816 Spondylosis without myelopathy or radiculopathy, lumbar region: Secondary | ICD-10-CM | POA: Diagnosis not present

## 2021-03-07 DIAGNOSIS — Z885 Allergy status to narcotic agent status: Secondary | ICD-10-CM | POA: Diagnosis not present

## 2021-03-07 DIAGNOSIS — K219 Gastro-esophageal reflux disease without esophagitis: Secondary | ICD-10-CM | POA: Diagnosis not present

## 2021-03-07 DIAGNOSIS — Z20822 Contact with and (suspected) exposure to covid-19: Secondary | ICD-10-CM | POA: Insufficient documentation

## 2021-03-07 DIAGNOSIS — M5117 Intervertebral disc disorders with radiculopathy, lumbosacral region: Secondary | ICD-10-CM | POA: Diagnosis not present

## 2021-03-07 DIAGNOSIS — E119 Type 2 diabetes mellitus without complications: Secondary | ICD-10-CM

## 2021-03-07 DIAGNOSIS — M47817 Spondylosis without myelopathy or radiculopathy, lumbosacral region: Secondary | ICD-10-CM | POA: Diagnosis not present

## 2021-03-07 DIAGNOSIS — M961 Postlaminectomy syndrome, not elsewhere classified: Secondary | ICD-10-CM | POA: Diagnosis not present

## 2021-03-07 DIAGNOSIS — Z01818 Encounter for other preprocedural examination: Secondary | ICD-10-CM | POA: Insufficient documentation

## 2021-03-07 DIAGNOSIS — E785 Hyperlipidemia, unspecified: Secondary | ICD-10-CM | POA: Diagnosis not present

## 2021-03-07 DIAGNOSIS — Z981 Arthrodesis status: Secondary | ICD-10-CM | POA: Diagnosis not present

## 2021-03-07 DIAGNOSIS — Z9889 Other specified postprocedural states: Secondary | ICD-10-CM | POA: Diagnosis not present

## 2021-03-07 DIAGNOSIS — M5416 Radiculopathy, lumbar region: Secondary | ICD-10-CM | POA: Diagnosis not present

## 2021-03-07 LAB — TYPE AND SCREEN
ABO/RH(D): B NEG
Antibody Screen: NEGATIVE

## 2021-03-07 LAB — SURGICAL PCR SCREEN
MRSA, PCR: NEGATIVE
Staphylococcus aureus: POSITIVE — AB

## 2021-03-07 LAB — BASIC METABOLIC PANEL
Anion gap: 13 (ref 5–15)
BUN: 20 mg/dL (ref 8–23)
CO2: 23 mmol/L (ref 22–32)
Calcium: 9.4 mg/dL (ref 8.9–10.3)
Chloride: 103 mmol/L (ref 98–111)
Creatinine, Ser: 1.09 mg/dL (ref 0.61–1.24)
GFR, Estimated: >60 mL/min (ref >60)
Glucose, Bld: 140 mg/dL — ABNORMAL HIGH (ref 70–99)
Potassium: 4 mmol/L (ref 3.5–5.1)
Sodium: 139 mmol/L (ref 135–145)

## 2021-03-07 LAB — CBC
HCT: 52 % (ref 39.0–52.0)
Hemoglobin: 18 g/dL — ABNORMAL HIGH (ref 13.0–17.0)
MCH: 31 pg (ref 26.0–34.0)
MCHC: 34.6 g/dL (ref 30.0–36.0)
MCV: 89.7 fL (ref 80.0–100.0)
Platelets: 154 10*3/uL (ref 150–400)
RBC: 5.8 MIL/uL (ref 4.22–5.81)
RDW: 13 % (ref 11.5–15.5)
WBC: 8.8 10*3/uL (ref 4.0–10.5)
nRBC: 0 % (ref 0.0–0.2)

## 2021-03-07 LAB — HEMOGLOBIN A1C
Hgb A1c MFr Bld: 6.4 % — ABNORMAL HIGH (ref 4.8–5.6)
Mean Plasma Glucose: 136.98 mg/dL

## 2021-03-07 LAB — GLUCOSE, CAPILLARY: Glucose-Capillary: 153 mg/dL — ABNORMAL HIGH (ref 70–99)

## 2021-03-07 NOTE — Progress Notes (Signed)
PCP - Dr. Kathyrn Lass Cardiologist - denies  PPM/ICD - denies   Chest x-ray - 05/17/17 EKG - 03/07/21 at PAT Stress Test - denies ECHO - denies Cardiac Cath - denies  Sleep Study - denies  DM- Type 2 Pt doesn't check CBG at home  Blood Thinner Instructions: n/a Aspirin Instructions: n/a  ERAS Protcol - yes PRE-SURGERY G2- given at PAT  COVID TEST- 03/07/21 at PAT   Anesthesia review: no  Patient denies shortness of breath, fever, cough and chest pain at PAT appointment   All instructions explained to the patient, with a verbal understanding of the material. Patient agrees to go over the instructions while at home for a better understanding. Patient also instructed to wear a mask in public after being tested for COVID-19. The opportunity to ask questions was provided.

## 2021-03-08 LAB — SARS CORONAVIRUS 2 (TAT 6-24 HRS): SARS Coronavirus 2: NEGATIVE

## 2021-03-09 NOTE — Anesthesia Preprocedure Evaluation (Addendum)
Anesthesia Evaluation  Patient identified by MRN, date of birth, ID band Patient awake    Reviewed: Allergy & Precautions, NPO status , Patient's Chart, lab work & pertinent test results  History of Anesthesia Complications Negative for: history of anesthetic complications  Airway Mallampati: II  TM Distance: >3 FB Neck ROM: Full    Dental  (+) Teeth Intact, Dental Advisory Given   Pulmonary neg pulmonary ROS,    Pulmonary exam normal        Cardiovascular hypertension, Pt. on medications and Pt. on home beta blockers Normal cardiovascular exam     Neuro/Psych negative neurological ROS     GI/Hepatic Neg liver ROS, GERD  ,  Endo/Other  diabetes, Oral Hypoglycemic Agents  Renal/GU negative Renal ROS  negative genitourinary   Musculoskeletal  (+) Arthritis , narcotic dependent  Abdominal   Peds  Hematology negative hematology ROS (+)   Anesthesia Other Findings   Reproductive/Obstetrics                            Anesthesia Physical Anesthesia Plan  ASA: 2  Anesthesia Plan: General   Post-op Pain Management: Ketamine IV, Dilaudid IV and Ofirmev IV (intra-op)   Induction: Intravenous  PONV Risk Score and Plan: 3 and Ondansetron, Dexamethasone, Treatment may vary due to age or medical condition and Midazolam  Airway Management Planned: Oral ETT  Additional Equipment: None  Intra-op Plan:   Post-operative Plan: Extubation in OR  Informed Consent: I have reviewed the patients History and Physical, chart, labs and discussed the procedure including the risks, benefits and alternatives for the proposed anesthesia with the patient or authorized representative who has indicated his/her understanding and acceptance.     Dental advisory given  Plan Discussed with:   Anesthesia Plan Comments:       Anesthesia Quick Evaluation

## 2021-03-10 ENCOUNTER — Inpatient Hospital Stay (HOSPITAL_COMMUNITY): Payer: Medicare Other

## 2021-03-10 ENCOUNTER — Encounter (HOSPITAL_COMMUNITY)
Admission: RE | Disposition: A | Discharge status: Home / Self Care | Payer: Self-pay | Source: Home / Self Care | Attending: Orthopedic Surgery

## 2021-03-10 ENCOUNTER — Inpatient Hospital Stay (HOSPITAL_COMMUNITY)
Admission: RE | Admit: 2021-03-10 | Discharge: 2021-03-11 | DRG: 455 | Disposition: A | Discharge status: Home / Self Care | Payer: Medicare Other | Attending: Orthopedic Surgery | Admitting: Orthopedic Surgery

## 2021-03-10 ENCOUNTER — Inpatient Hospital Stay (HOSPITAL_COMMUNITY): Payer: Medicare Other | Admitting: Certified Registered"

## 2021-03-10 ENCOUNTER — Other Ambulatory Visit: Payer: Self-pay

## 2021-03-10 ENCOUNTER — Encounter (HOSPITAL_COMMUNITY): Payer: Self-pay | Admitting: Orthopedic Surgery

## 2021-03-10 DIAGNOSIS — I10 Essential (primary) hypertension: Secondary | ICD-10-CM | POA: Diagnosis present

## 2021-03-10 DIAGNOSIS — K219 Gastro-esophageal reflux disease without esophagitis: Secondary | ICD-10-CM | POA: Diagnosis present

## 2021-03-10 DIAGNOSIS — E119 Type 2 diabetes mellitus without complications: Secondary | ICD-10-CM | POA: Diagnosis present

## 2021-03-10 DIAGNOSIS — Z885 Allergy status to narcotic agent status: Secondary | ICD-10-CM | POA: Diagnosis not present

## 2021-03-10 DIAGNOSIS — Z419 Encounter for procedure for purposes other than remedying health state, unspecified: Secondary | ICD-10-CM

## 2021-03-10 DIAGNOSIS — Z981 Arthrodesis status: Secondary | ICD-10-CM | POA: Diagnosis not present

## 2021-03-10 DIAGNOSIS — Z20822 Contact with and (suspected) exposure to covid-19: Secondary | ICD-10-CM | POA: Diagnosis not present

## 2021-03-10 DIAGNOSIS — M961 Postlaminectomy syndrome, not elsewhere classified: Secondary | ICD-10-CM | POA: Diagnosis present

## 2021-03-10 DIAGNOSIS — M47816 Spondylosis without myelopathy or radiculopathy, lumbar region: Secondary | ICD-10-CM | POA: Diagnosis not present

## 2021-03-10 DIAGNOSIS — Z7984 Long term (current) use of oral hypoglycemic drugs: Secondary | ICD-10-CM | POA: Diagnosis not present

## 2021-03-10 DIAGNOSIS — M5410 Radiculopathy, site unspecified: Secondary | ICD-10-CM | POA: Diagnosis present

## 2021-03-10 DIAGNOSIS — M4326 Fusion of spine, lumbar region: Secondary | ICD-10-CM | POA: Diagnosis not present

## 2021-03-10 DIAGNOSIS — M47817 Spondylosis without myelopathy or radiculopathy, lumbosacral region: Secondary | ICD-10-CM | POA: Diagnosis not present

## 2021-03-10 DIAGNOSIS — Z888 Allergy status to other drugs, medicaments and biological substances status: Secondary | ICD-10-CM | POA: Diagnosis not present

## 2021-03-10 DIAGNOSIS — E785 Hyperlipidemia, unspecified: Secondary | ICD-10-CM | POA: Diagnosis present

## 2021-03-10 DIAGNOSIS — M5117 Intervertebral disc disorders with radiculopathy, lumbosacral region: Principal | ICD-10-CM | POA: Diagnosis present

## 2021-03-10 DIAGNOSIS — Z79899 Other long term (current) drug therapy: Secondary | ICD-10-CM | POA: Diagnosis not present

## 2021-03-10 DIAGNOSIS — M4327 Fusion of spine, lumbosacral region: Secondary | ICD-10-CM | POA: Diagnosis not present

## 2021-03-10 DIAGNOSIS — M5416 Radiculopathy, lumbar region: Secondary | ICD-10-CM | POA: Diagnosis present

## 2021-03-10 HISTORY — PX: TRANSFORAMINAL LUMBAR INTERBODY FUSION (TLIF) WITH PEDICLE SCREW FIXATION 1 LEVEL: SHX6141

## 2021-03-10 LAB — GLUCOSE, CAPILLARY
Glucose-Capillary: 148 mg/dL — ABNORMAL HIGH (ref 70–99)
Glucose-Capillary: 143 mg/dL — ABNORMAL HIGH (ref 70–99)
Glucose-Capillary: 171 mg/dL — ABNORMAL HIGH (ref 70–99)
Glucose-Capillary: 165 mg/dL — ABNORMAL HIGH (ref 70–99)

## 2021-03-10 SURGERY — TRANSFORAMINAL LUMBAR INTERBODY FUSION (TLIF) WITH PEDICLE SCREW FIXATION 1 LEVEL
Anesthesia: General | Site: Spine Lumbar | Laterality: Left

## 2021-03-10 MED ORDER — BUPIVACAINE-EPINEPHRINE 0.25% -1:200000 IJ SOLN
INTRAMUSCULAR | Status: DC | PRN
  Administered 2021-03-10: 9 mL
  Administered 2021-03-10: 20 mL

## 2021-03-10 MED ORDER — LOSARTAN POTASSIUM 50 MG PO TABS
100.0000 mg | ORAL_TABLET | Freq: Every day | ORAL | Status: DC
  Administered 2021-03-11: 100 mg via ORAL
  Filled 2021-03-10: qty 2

## 2021-03-10 MED ORDER — SODIUM CHLORIDE 0.9% FLUSH: 3.0000 mL | INTRAVENOUS | Status: DC | PRN

## 2021-03-10 MED ORDER — MORPHINE SULFATE (PF) 2 MG/ML IV SOLN: 1.0000 mg | INTRAVENOUS | Status: DC | PRN

## 2021-03-10 MED ORDER — METOPROLOL TARTRATE 25 MG PO TABS
25.0000 mg | ORAL_TABLET | Freq: Two times a day (BID) | ORAL | Status: DC
  Administered 2021-03-11: 25 mg via ORAL
  Filled 2021-03-10: qty 1

## 2021-03-10 MED ORDER — BUPIVACAINE-EPINEPHRINE (PF) 0.25% -1:200000 IJ SOLN
INTRAMUSCULAR | Status: AC
  Filled 2021-03-10: qty 30

## 2021-03-10 MED ORDER — AMISULPRIDE (ANTIEMETIC) 5 MG/2ML IV SOLN: 10.0000 mg | Freq: Once | INTRAVENOUS | Status: DC | PRN

## 2021-03-10 MED ORDER — FLEET ENEMA 7-19 GM/118ML RE ENEM: 1.0000 | ENEMA | Freq: Once | RECTAL | Status: DC | PRN

## 2021-03-10 MED ORDER — THROMBIN 20000 UNITS EX SOLR
CUTANEOUS | Status: DC | PRN
  Administered 2021-03-10: 20000 [IU] via TOPICAL

## 2021-03-10 MED ORDER — SUCCINYLCHOLINE CHLORIDE 200 MG/10ML IV SOSY
PREFILLED_SYRINGE | INTRAVENOUS | Status: AC
  Filled 2021-03-10: qty 10

## 2021-03-10 MED ORDER — EPHEDRINE SULFATE-NACL 50-0.9 MG/10ML-% IV SOSY
PREFILLED_SYRINGE | INTRAVENOUS | Status: DC | PRN
  Administered 2021-03-10: 5 mg via INTRAVENOUS

## 2021-03-10 MED ORDER — ONDANSETRON HCL 4 MG/2ML IJ SOLN
INTRAMUSCULAR | Status: AC
  Filled 2021-03-10: qty 10

## 2021-03-10 MED ORDER — ONDANSETRON HCL 4 MG/2ML IJ SOLN: 4.0000 mg | Freq: Four times a day (QID) | INTRAMUSCULAR | Status: DC | PRN

## 2021-03-10 MED ORDER — ROCURONIUM BROMIDE 10 MG/ML (PF) SYRINGE
PREFILLED_SYRINGE | INTRAVENOUS | Status: AC
  Filled 2021-03-10: qty 20

## 2021-03-10 MED ORDER — METHYLENE BLUE 0.5 % INJ SOLN
INTRAVENOUS | Status: AC
  Filled 2021-03-10: qty 10

## 2021-03-10 MED ORDER — BUPIVACAINE LIPOSOME 1.3 % IJ SUSP
INTRAMUSCULAR | Status: AC
  Filled 2021-03-10: qty 20

## 2021-03-10 MED ORDER — CYCLOBENZAPRINE HCL 10 MG PO TABS: 10.0000 mg | ORAL_TABLET | Freq: Every day | ORAL | Status: DC

## 2021-03-10 MED ORDER — CEFAZOLIN SODIUM-DEXTROSE 2-4 GM/100ML-% IV SOLN
2.0000 g | INTRAVENOUS | Status: AC
  Administered 2021-03-10: 2 g via INTRAVENOUS
  Filled 2021-03-10: qty 100

## 2021-03-10 MED ORDER — BACLOFEN 10 MG PO TABS
10.0000 mg | ORAL_TABLET | Freq: Two times a day (BID) | ORAL | Status: DC
  Administered 2021-03-10 – 2021-03-11 (×2): 10 mg via ORAL
  Filled 2021-03-10 (×2): qty 1

## 2021-03-10 MED ORDER — DEXAMETHASONE SODIUM PHOSPHATE 10 MG/ML IJ SOLN
INTRAMUSCULAR | Status: DC | PRN
Start: 2021-03-10 — End: 2021-03-10
  Administered 2021-03-10: 10 mg via INTRAVENOUS

## 2021-03-10 MED ORDER — SODIUM CHLORIDE 0.9 % IV SOLN
250.0000 mL | INTRAVENOUS | Status: DC
  Administered 2021-03-10: 250 mL via INTRAVENOUS

## 2021-03-10 MED ORDER — PHENYLEPHRINE HCL-NACL 20-0.9 MG/250ML-% IV SOLN
INTRAVENOUS | Status: DC | PRN
  Administered 2021-03-10: 40 ug/min via INTRAVENOUS

## 2021-03-10 MED ORDER — ONDANSETRON HCL 4 MG PO TABS: 4.0000 mg | ORAL_TABLET | Freq: Four times a day (QID) | ORAL | Status: DC | PRN

## 2021-03-10 MED ORDER — HYDROMORPHONE HCL 1 MG/ML IJ SOLN
INTRAMUSCULAR | Status: DC | PRN
  Administered 2021-03-10: .5 mg via INTRAVENOUS

## 2021-03-10 MED ORDER — OXYCODONE-ACETAMINOPHEN 7.5-325 MG PO TABS
1.0000 | ORAL_TABLET | ORAL | Status: DC | PRN
  Administered 2021-03-10 – 2021-03-11 (×2): 1 via ORAL
  Filled 2021-03-10 (×2): qty 1

## 2021-03-10 MED ORDER — HYDROMORPHONE HCL 1 MG/ML IJ SOLN
INTRAMUSCULAR | Status: AC
  Filled 2021-03-10: qty 0.5

## 2021-03-10 MED ORDER — ATROPINE SULFATE 0.4 MG/ML IV SOLN
INTRAVENOUS | Status: AC
  Filled 2021-03-10: qty 2

## 2021-03-10 MED ORDER — ACETAMINOPHEN 650 MG RE SUPP: 650.0000 mg | RECTAL | Status: DC | PRN

## 2021-03-10 MED ORDER — SENNOSIDES-DOCUSATE SODIUM 8.6-50 MG PO TABS: 1.0000 | ORAL_TABLET | Freq: Every evening | ORAL | Status: DC | PRN

## 2021-03-10 MED ORDER — MUPIROCIN 2 % EX OINT
TOPICAL_OINTMENT | CUTANEOUS | Status: AC
  Filled 2021-03-10: qty 22

## 2021-03-10 MED ORDER — ONDANSETRON HCL 4 MG/2ML IJ SOLN: 4.0000 mg | Freq: Once | INTRAMUSCULAR | Status: DC | PRN

## 2021-03-10 MED ORDER — THROMBIN (RECOMBINANT) 20000 UNITS EX SOLR
CUTANEOUS | Status: AC
  Filled 2021-03-10: qty 20000

## 2021-03-10 MED ORDER — CHLORHEXIDINE GLUCONATE 0.12 % MT SOLN
15.0000 mL | Freq: Once | OROMUCOSAL | Status: AC
  Administered 2021-03-10: 15 mL via OROMUCOSAL
  Filled 2021-03-10: qty 15

## 2021-03-10 MED ORDER — AMLODIPINE BESYLATE 10 MG PO TABS
10.0000 mg | ORAL_TABLET | Freq: Every day | ORAL | Status: DC
  Administered 2021-03-11: 10 mg via ORAL
  Filled 2021-03-10: qty 1

## 2021-03-10 MED ORDER — OXYCODONE HCL ER 10 MG PO T12A
10.0000 mg | EXTENDED_RELEASE_TABLET | Freq: Two times a day (BID) | ORAL | Status: DC
  Administered 2021-03-10 – 2021-03-11 (×3): 10 mg via ORAL
  Filled 2021-03-10 (×3): qty 1

## 2021-03-10 MED ORDER — PHENYLEPHRINE 40 MCG/ML (10ML) SYRINGE FOR IV PUSH (FOR BLOOD PRESSURE SUPPORT)
PREFILLED_SYRINGE | INTRAVENOUS | Status: AC
  Filled 2021-03-10: qty 30

## 2021-03-10 MED ORDER — MENTHOL 3 MG MT LOZG: 1.0000 | LOZENGE | OROMUCOSAL | Status: DC | PRN

## 2021-03-10 MED ORDER — ACETAMINOPHEN 325 MG PO TABS: 650.0000 mg | ORAL_TABLET | ORAL | Status: DC | PRN

## 2021-03-10 MED ORDER — OXYCODONE HCL 5 MG/5ML PO SOLN: 5.0000 mg | Freq: Once | ORAL | Status: DC | PRN

## 2021-03-10 MED ORDER — LACTATED RINGERS IV SOLN: INTRAVENOUS | Status: DC | PRN

## 2021-03-10 MED ORDER — CYCLOBENZAPRINE HCL 10 MG PO TABS
10.0000 mg | ORAL_TABLET | Freq: Every day | ORAL | Status: DC
  Administered 2021-03-10: 10 mg via ORAL
  Filled 2021-03-10: qty 1

## 2021-03-10 MED ORDER — ORAL CARE MOUTH RINSE: 15.0000 mL | Freq: Once | OROMUCOSAL | Status: AC

## 2021-03-10 MED ORDER — BUPIVACAINE LIPOSOME 1.3 % IJ SUSP
INTRAMUSCULAR | Status: DC | PRN
  Administered 2021-03-10: 20 mL

## 2021-03-10 MED ORDER — FENTANYL CITRATE (PF) 250 MCG/5ML IJ SOLN
INTRAMUSCULAR | Status: AC
  Filled 2021-03-10: qty 5

## 2021-03-10 MED ORDER — HEMOSTATIC AGENTS (NO CHARGE) OPTIME
TOPICAL | Status: DC | PRN
Start: 2021-03-10 — End: 2021-03-10
  Administered 2021-03-10: 1 via TOPICAL

## 2021-03-10 MED ORDER — SUGAMMADEX SODIUM 200 MG/2ML IV SOLN
INTRAVENOUS | Status: DC | PRN
  Administered 2021-03-10: 200 mg via INTRAVENOUS

## 2021-03-10 MED ORDER — METFORMIN HCL 500 MG PO TABS
500.0000 mg | ORAL_TABLET | Freq: Two times a day (BID) | ORAL | Status: DC
  Administered 2021-03-10 – 2021-03-11 (×2): 500 mg via ORAL
  Filled 2021-03-10 (×2): qty 1

## 2021-03-10 MED ORDER — PROPOFOL 10 MG/ML IV BOLUS
INTRAVENOUS | Status: AC
  Filled 2021-03-10: qty 20

## 2021-03-10 MED ORDER — ZOLPIDEM TARTRATE 5 MG PO TABS: 5.0000 mg | ORAL_TABLET | Freq: Every evening | ORAL | Status: DC | PRN

## 2021-03-10 MED ORDER — POLYETHYLENE GLYCOL 3350 17 G PO PACK: 17.0000 g | PACK | ORAL | Status: DC

## 2021-03-10 MED ORDER — HYDROMORPHONE HCL 1 MG/ML IJ SOLN: 0.2500 mg | INTRAMUSCULAR | Status: DC | PRN

## 2021-03-10 MED ORDER — PROPOFOL 10 MG/ML IV BOLUS
INTRAVENOUS | Status: DC | PRN
Start: 2021-03-10 — End: 2021-03-10
  Administered 2021-03-10: 150 mg via INTRAVENOUS

## 2021-03-10 MED ORDER — HYDROCODONE-ACETAMINOPHEN 5-325 MG PO TABS: 1.0000 | ORAL_TABLET | ORAL | Status: DC | PRN

## 2021-03-10 MED ORDER — SODIUM CHLORIDE 0.9% FLUSH
3.0000 mL | Freq: Two times a day (BID) | INTRAVENOUS | Status: DC
  Administered 2021-03-10 (×2): 3 mL via INTRAVENOUS

## 2021-03-10 MED ORDER — OXYCODONE HCL 5 MG PO TABS: 5.0000 mg | ORAL_TABLET | Freq: Once | ORAL | Status: DC | PRN

## 2021-03-10 MED ORDER — EPHEDRINE 5 MG/ML INJ
INTRAVENOUS | Status: AC
  Filled 2021-03-10: qty 10

## 2021-03-10 MED ORDER — ACETAMINOPHEN 10 MG/ML IV SOLN
INTRAVENOUS | Status: AC
  Filled 2021-03-10: qty 100

## 2021-03-10 MED ORDER — LIDOCAINE 2% (20 MG/ML) 5 ML SYRINGE
INTRAMUSCULAR | Status: DC | PRN
Start: 2021-03-10 — End: 2021-03-10
  Administered 2021-03-10: 100 mg via INTRAVENOUS

## 2021-03-10 MED ORDER — ALLOPURINOL 300 MG PO TABS
300.0000 mg | ORAL_TABLET | Freq: Every day | ORAL | Status: DC
  Administered 2021-03-11: 300 mg via ORAL
  Filled 2021-03-10: qty 1

## 2021-03-10 MED ORDER — BISACODYL 5 MG PO TBEC: 5.0000 mg | DELAYED_RELEASE_TABLET | Freq: Every day | ORAL | Status: DC | PRN

## 2021-03-10 MED ORDER — LIDOCAINE 2% (20 MG/ML) 5 ML SYRINGE
INTRAMUSCULAR | Status: AC
  Filled 2021-03-10: qty 20

## 2021-03-10 MED ORDER — DOCUSATE SODIUM 100 MG PO CAPS
100.0000 mg | ORAL_CAPSULE | Freq: Two times a day (BID) | ORAL | Status: DC
  Administered 2021-03-10 – 2021-03-11 (×3): 100 mg via ORAL
  Filled 2021-03-10 (×3): qty 1

## 2021-03-10 MED ORDER — 0.9 % SODIUM CHLORIDE (POUR BTL) OPTIME
TOPICAL | Status: DC | PRN
  Administered 2021-03-10: 1000 mL

## 2021-03-10 MED ORDER — ACETAMINOPHEN 10 MG/ML IV SOLN
INTRAVENOUS | Status: DC | PRN
  Administered 2021-03-10: 1000 mg via INTRAVENOUS

## 2021-03-10 MED ORDER — ROSUVASTATIN CALCIUM 5 MG PO TABS
10.0000 mg | ORAL_TABLET | Freq: Every day | ORAL | Status: DC
  Administered 2021-03-11: 10 mg via ORAL
  Filled 2021-03-10: qty 2

## 2021-03-10 MED ORDER — CEFAZOLIN SODIUM-DEXTROSE 2-4 GM/100ML-% IV SOLN
2.0000 g | Freq: Three times a day (TID) | INTRAVENOUS | Status: AC
  Administered 2021-03-10 (×2): 2 g via INTRAVENOUS
  Filled 2021-03-10 (×2): qty 100

## 2021-03-10 MED ORDER — KETAMINE HCL 50 MG/5ML IJ SOSY
PREFILLED_SYRINGE | INTRAMUSCULAR | Status: AC
  Filled 2021-03-10: qty 5

## 2021-03-10 MED ORDER — ROCURONIUM BROMIDE 10 MG/ML (PF) SYRINGE
PREFILLED_SYRINGE | INTRAVENOUS | Status: DC | PRN
  Administered 2021-03-10: 15 mg via INTRAVENOUS
  Administered 2021-03-10: 65 mg via INTRAVENOUS
  Administered 2021-03-10: 10 mg via INTRAVENOUS

## 2021-03-10 MED ORDER — ALUM & MAG HYDROXIDE-SIMETH 200-200-20 MG/5ML PO SUSP
30.0000 mL | Freq: Four times a day (QID) | ORAL | Status: DC | PRN
  Administered 2021-03-10 – 2021-03-11 (×2): 30 mL via ORAL
  Filled 2021-03-10 (×2): qty 30

## 2021-03-10 MED ORDER — NALOXONE HCL 4 MG/0.1ML NA LIQD: 1.0000 | Freq: Once | NASAL | Status: DC

## 2021-03-10 MED ORDER — SUGAMMADEX SODIUM 500 MG/5ML IV SOLN
INTRAVENOUS | Status: AC
  Filled 2021-03-10: qty 5

## 2021-03-10 MED ORDER — FENTANYL CITRATE (PF) 100 MCG/2ML IJ SOLN
INTRAMUSCULAR | Status: DC | PRN
  Administered 2021-03-10: 50 ug via INTRAVENOUS
  Administered 2021-03-10: 100 ug via INTRAVENOUS

## 2021-03-10 MED ORDER — PHENOL 1.4 % MT LIQD: 1.0000 | OROMUCOSAL | Status: DC | PRN

## 2021-03-10 MED ORDER — KETAMINE HCL 10 MG/ML IJ SOLN
INTRAMUSCULAR | Status: DC | PRN
Start: 2021-03-10 — End: 2021-03-10
  Administered 2021-03-10 (×2): 10 mg via INTRAVENOUS
  Administered 2021-03-10: 5 mg via INTRAVENOUS
  Administered 2021-03-10: 30 mg via INTRAVENOUS
  Administered 2021-03-10: 10 mg via INTRAVENOUS

## 2021-03-10 MED ORDER — ACETAMINOPHEN 325 MG PO TABS: 650.0000 mg | ORAL_TABLET | Freq: Four times a day (QID) | ORAL | Status: DC | PRN

## 2021-03-10 MED ORDER — KETOROLAC TROMETHAMINE 30 MG/ML IJ SOLN
INTRAMUSCULAR | Status: AC
  Filled 2021-03-10: qty 4

## 2021-03-10 MED ORDER — LACTATED RINGERS IV SOLN: INTRAVENOUS | Status: DC

## 2021-03-10 MED ORDER — PHENYLEPHRINE 40 MCG/ML (10ML) SYRINGE FOR IV PUSH (FOR BLOOD PRESSURE SUPPORT)
PREFILLED_SYRINGE | INTRAVENOUS | Status: DC | PRN
Start: 2021-03-10 — End: 2021-03-10
  Administered 2021-03-10 (×2): 80 ug via INTRAVENOUS

## 2021-03-10 MED ORDER — POTASSIUM CHLORIDE IN NACL 20-0.9 MEQ/L-% IV SOLN: INTRAVENOUS | Status: DC

## 2021-03-10 SURGICAL SUPPLY — 90 items
AGENT HMST KT MTR STRL THRMB (HEMOSTASIS)
APL SKNCLS STERI-STRIP NONHPOA (GAUZE/BANDAGES/DRESSINGS) ×1
BAG COUNTER SPONGE SURGICOUNT (BAG) ×3 IMPLANT
BAG SPNG CNTER NS LX DISP (BAG) ×1
BENZOIN TINCTURE PRP APPL 2/3 (GAUZE/BANDAGES/DRESSINGS) ×3 IMPLANT
BLADE CLIPPER SURG (BLADE) ×1 IMPLANT
BUR PRESCISION 1.7 ELITE (BURR) ×3 IMPLANT
BUR ROUND FLUTED 5 RND (BURR) ×3 IMPLANT
BUR ROUND PRECISION 4.0 (BURR) IMPLANT
BUR SABER RD CUTTING 3.0 (BURR) IMPLANT
CAGE CONCORDE BULLET 9X9X27 (Cage) ×1 IMPLANT
CANNULA GRAFT BNE VG PRE-FILL (Bone Implant) IMPLANT
CARTRIDGE OIL MAESTRO DRILL (MISCELLANEOUS) ×2 IMPLANT
CNTNR URN SCR LID CUP LEK RST (MISCELLANEOUS) ×2 IMPLANT
CONT SPEC 4OZ STRL OR WHT (MISCELLANEOUS) ×4
COVER BACK TABLE 60X90IN (DRAPES) ×3 IMPLANT
COVER MAYO STAND STRL (DRAPES) ×6 IMPLANT
COVER SURGICAL LIGHT HANDLE (MISCELLANEOUS) ×3 IMPLANT
DIFFUSER DRILL AIR PNEUMATIC (MISCELLANEOUS) ×3 IMPLANT
DISPENSER GRAFT BNE VG (MISCELLANEOUS) IMPLANT
DISPENSER VIVIGEN BONE GRAFT (MISCELLANEOUS) ×2 IMPLANT
DRAIN CHANNEL 15F RND FF W/TCR (WOUND CARE) IMPLANT
DRAPE C-ARM 42X72 X-RAY (DRAPES) ×3 IMPLANT
DRAPE C-ARMOR (DRAPES) IMPLANT
DRAPE POUCH INSTRU U-SHP 10X18 (DRAPES) ×3 IMPLANT
DRAPE SURG 17X23 STRL (DRAPES) ×12 IMPLANT
DURAPREP 26ML APPLICATOR (WOUND CARE) ×3 IMPLANT
ELECT BLADE 4.0 EZ CLEAN MEGAD (MISCELLANEOUS) ×2
ELECT CAUTERY BLADE 6.4 (BLADE) ×3 IMPLANT
ELECT REM PT RETURN 9FT ADLT (ELECTROSURGICAL) ×2
ELECTRODE BLDE 4.0 EZ CLN MEGD (MISCELLANEOUS) ×2 IMPLANT
ELECTRODE REM PT RTRN 9FT ADLT (ELECTROSURGICAL) ×2 IMPLANT
EVACUATOR SILICONE 100CC (DRAIN) IMPLANT
FILTER STRAW FLUID ASPIR (MISCELLANEOUS) ×2 IMPLANT
GAUZE 4X4 16PLY ~~LOC~~+RFID DBL (SPONGE) ×3 IMPLANT
GAUZE SPONGE 4X4 12PLY STRL (GAUZE/BANDAGES/DRESSINGS) ×3 IMPLANT
GLOVE SRG 8 PF TXTR STRL LF DI (GLOVE) ×2 IMPLANT
GLOVE SURG ENC MOIS LTX SZ6.5 (GLOVE) ×3 IMPLANT
GLOVE SURG ENC MOIS LTX SZ8 (GLOVE) ×3 IMPLANT
GLOVE SURG UNDER POLY LF SZ7 (GLOVE) ×3 IMPLANT
GLOVE SURG UNDER POLY LF SZ8 (GLOVE) ×2
GOWN STRL REUS W/ TWL LRG LVL3 (GOWN DISPOSABLE) ×4 IMPLANT
GOWN STRL REUS W/ TWL XL LVL3 (GOWN DISPOSABLE) ×2 IMPLANT
GOWN STRL REUS W/TWL LRG LVL3 (GOWN DISPOSABLE) ×4
GOWN STRL REUS W/TWL XL LVL3 (GOWN DISPOSABLE) ×2
GRAFT BONE CANNULA VIVIGEN 3 (Bone Implant) ×6 IMPLANT
IV CATH 14GX2 1/4 (CATHETERS) ×3 IMPLANT
KIT BASIN OR (CUSTOM PROCEDURE TRAY) ×3 IMPLANT
KIT POSITION SURG JACKSON T1 (MISCELLANEOUS) ×3 IMPLANT
KIT TURNOVER KIT B (KITS) ×3 IMPLANT
MARKER SKIN DUAL TIP RULER LAB (MISCELLANEOUS) ×5 IMPLANT
NDL 18GX1X1/2 (RX/OR ONLY) (NEEDLE) ×2 IMPLANT
NDL HYPO 25GX1X1/2 BEV (NEEDLE) ×2 IMPLANT
NDL SPNL 18GX3.5 QUINCKE PK (NEEDLE) ×4 IMPLANT
NEEDLE 18GX1X1/2 (RX/OR ONLY) (NEEDLE) IMPLANT
NEEDLE 22X1 1/2 (OR ONLY) (NEEDLE) ×4 IMPLANT
NEEDLE HYPO 25GX1X1/2 BEV (NEEDLE) ×2 IMPLANT
NEEDLE SPNL 18GX3.5 QUINCKE PK (NEEDLE) ×4 IMPLANT
NS IRRIG 1000ML POUR BTL (IV SOLUTION) ×3 IMPLANT
OIL CARTRIDGE MAESTRO DRILL (MISCELLANEOUS) ×2
PACK LAMINECTOMY ORTHO (CUSTOM PROCEDURE TRAY) ×3 IMPLANT
PACK UNIVERSAL I (CUSTOM PROCEDURE TRAY) ×3 IMPLANT
PAD ARMBOARD 7.5X6 YLW CONV (MISCELLANEOUS) ×6 IMPLANT
PATTIES SURGICAL .5 X1 (DISPOSABLE) ×3 IMPLANT
PATTIES SURGICAL .5X1.5 (GAUZE/BANDAGES/DRESSINGS) ×3 IMPLANT
ROD PRE BENT EXP 40MM (Rod) ×2 IMPLANT
SCREW CORTICAL VIPER 7X40MM (Screw) ×2 IMPLANT
SCREW SET SINGLE INNER (Screw) ×4 IMPLANT
SCREW VIPER CORT FIX 6.00X30 (Screw) ×2 IMPLANT
SPONGE INTESTINAL PEANUT (DISPOSABLE) ×3 IMPLANT
SPONGE SURGIFOAM ABS GEL 100 (HEMOSTASIS) ×3 IMPLANT
STRIP CLOSURE SKIN 1/2X4 (GAUZE/BANDAGES/DRESSINGS) ×6 IMPLANT
SURGIFLO W/THROMBIN 8M KIT (HEMOSTASIS) IMPLANT
SUT MNCRL AB 4-0 PS2 18 (SUTURE) ×3 IMPLANT
SUT VIC AB 0 CT1 18XCR BRD 8 (SUTURE) ×2 IMPLANT
SUT VIC AB 0 CT1 8-18 (SUTURE) ×2
SUT VIC AB 1 CT1 18XCR BRD 8 (SUTURE) ×2 IMPLANT
SUT VIC AB 1 CT1 8-18 (SUTURE) ×2
SUT VIC AB 2-0 CT2 18 VCP726D (SUTURE) ×4 IMPLANT
SYR 20ML LL LF (SYRINGE) ×6 IMPLANT
SYR BULB IRRIG 60ML STRL (SYRINGE) ×3 IMPLANT
SYR CONTROL 10ML LL (SYRINGE) ×6 IMPLANT
SYR TB 1ML LUER SLIP (SYRINGE) ×2 IMPLANT
TAP EXPEDIUM DL 5.0 (INSTRUMENTS) ×1 IMPLANT
TAP EXPEDIUM DL 6.0 (INSTRUMENTS) ×1 IMPLANT
TAP EXPEDIUM DL 7.0 (INSTRUMENTS) ×2
TAP EXPEDIUM DL 7X2 (INSTRUMENTS) IMPLANT
TRAY FOLEY MTR SLVR 16FR STAT (SET/KITS/TRAYS/PACK) ×3 IMPLANT
WATER STERILE IRR 1000ML POUR (IV SOLUTION) ×3 IMPLANT
YANKAUER SUCT BULB TIP NO VENT (SUCTIONS) ×3 IMPLANT

## 2021-03-10 NOTE — H&P (Signed)
PREOPERATIVE H&P  Chief Complaint: Left leg pain  HPI: Jeff Cunningham is a 71 y.o. male who presents with ongoing pain in the left leg  MRI reveals a recurrent left L5/S1 HNP  Patient has failed multiple forms of conservative care and continues to have pain (see office notes for additional details regarding the patient's full course of treatment)  Past Medical History:  Diagnosis Date   Arthritis    Carpal tunnel syndrome    DDD (degenerative disc disease), cervical    Depression    Diabetes mellitus    GERD (gastroesophageal reflux disease)    Gout    Hyperlipidemia    Hypertension    Snores    Wears glasses    Past Surgical History:  Procedure Laterality Date   CARPAL TUNNEL RELEASE  2008   rt   CARPAL TUNNEL RELEASE  01/16/2012   Procedure: CARPAL TUNNEL RELEASE;  Surgeon: Hessie Dibble, MD;  Location: [facility];  Service: Orthopedics;  Laterality: Left;   COLONOSCOPY     LUMBAR LAMINECTOMY/DECOMPRESSION MICRODISCECTOMY Left 05/23/2017   Procedure: LEFT SIDED LUMBAR 5 SACRUM 1 MICRODISCECTOMY;  Surgeon: Phylliss Bob, MD;  Location: [facility];  Service: Orthopedics;  Laterality: Left;   POLYPECTOMY     SHOULDER ARTHROSCOPY  1983   left   SHOULDER SURGERY     disloctaed shoulder left 30 yrs ago    STERIOD INJECTION Right 05/12/2013   Procedure:   RIGHT WRIST DEQUERVAINS INJECTION;  Surgeon: Jolyn Nap, MD;  Location: [facility];  Service: Orthopedics;  Laterality: Right;   ULNAR NERVE TRANSPOSITION Right 12/30/2012   Procedure: RIGHT ULNAR NEUROPLASTY AT ELBOW ;  Surgeon: Jolyn Nap, MD;  Location: [facility];  Service: Orthopedics;  Laterality: Right;   ULNAR NERVE TRANSPOSITION Left 05/12/2013   Procedure: LEFT ULNAR NEUROPLASTY AT ELBOW;  Surgeon: Jolyn Nap, MD;  Location: [facility];  Service: Orthopedics;  Laterality: Left;   ULNAR NERVE TRANSPOSITION Left 04/20/2015   Procedure:  LEFT ULNAR NERVE TRANSPOSITION;  Surgeon: Milly Jakob, MD;  Location: [place];  Service: Orthopedics;  Laterality: Left;   Social History   Socioeconomic History   Marital status: Single    Spouse name: Not on file   Number of children: Not on file   Years of education: Not on file   Highest education level: Not on file  Occupational History   Not on file  Tobacco Use   Smoking status: Never   Smokeless tobacco: Never  Vaping Use   Vaping Use: Never used  Substance and Sexual Activity   Alcohol use: Not Currently   Drug use: No   Sexual activity: Not on file  Other Topics Concern   Not on file  Social History Narrative   Not on file   Social Determinants of Health   Financial Resource Strain: Not on file  Food Insecurity: Not on file  Transportation Needs: Not on file  Physical Activity: Not on file  Stress: Not on file  Social Connections: Not on file   Family History  Problem Relation Age of Onset   Colon cancer Neg Hx    Esophageal cancer Neg Hx    Pancreatic cancer Neg Hx    Prostate cancer Neg Hx    Rectal cancer Neg Hx    Stomach cancer Neg Hx    Colon polyps Neg Hx    Allergies  Allergen Reactions  Statins     Per pt statin drugs cause muscle aches   Iodine Rash   Prior to Admission medications   Medication Sig Start Date End Date Taking? Authorizing Provider  allopurinol (ZYLOPRIM) 300 MG tablet Take 300 mg by mouth daily.   Yes [provider]  amLODipine (NORVASC) 10 MG tablet Take 10 mg by mouth daily.   Yes [provider]  baclofen (LIORESAL) 10 MG tablet Take 10 mg by mouth 2 (two) times daily. 02/04/21  Yes [provider]  losartan (COZAAR) 100 MG tablet Take 100 mg by mouth daily.   Yes [provider]  metFORMIN (GLUCOPHAGE) 500 MG tablet Take 500 mg by mouth 2 (two) times daily with a meal.   Yes [provider]  metoprolol tartrate (LOPRESSOR) 25 MG tablet Take 25 mg by  mouth 2 (two) times daily.   Yes [provider]  NARCAN 4 MG/0.1ML LIQD nasal spray kit Place 1 spray into the nose once. accidental opoid overdose 04/23/17  Yes [provider]  oxyCODONE-acetaminophen (PERCOCET) 10-325 MG tablet Take 1 tablet by mouth every 8 (eight) hours as needed for pain. 02/08/21  Yes [provider]  polyethylene glycol powder (GLYCOLAX/MIRALAX) 17 GM/SCOOP powder Take 17 g by mouth 2 (two) times a week.   Yes [provider]  rosuvastatin (CRESTOR) 20 MG tablet Take 10 mg by mouth daily.   Yes [provider]  tadalafil (CIALIS) 10 MG tablet Take 10 mg by mouth daily as needed for erectile dysfunction. 01/11/21  Yes [provider]  oxyCODONE-acetaminophen (ROXICET) 5-325 MG tablet Take 1-2 tablets by mouth every 4 (four) hours as needed. Patient not taking: Reported on 03/02/2021 04/20/15   Milly Jakob, MD     All other systems have been reviewed and were otherwise negative with the exception of those mentioned in the HPI and as above.  Physical Exam: Vitals:   03/10/21 0610  BP: (!) 144/81  Pulse: 81  Resp: 18  Temp: 98.7 F (37.1 C)  SpO2: 95%    Body mass index is 31.95 kg/m.  General: Alert, no acute distress Cardiovascular: No pedal edema Respiratory: No cyanosis, no use of accessory musculature Skin: No lesions in the area of chief complaint Neurologic: Sensation intact distally Psychiatric: Patient is competent for consent with normal mood and affect Lymphatic: No axillary or cervical lymphadenopathy   Assessment/Plan: LEFT S1 RADICULOPATHY DUE TO RECURRENT LEFT L5/S1 [place] for Procedure(s): LEFT LUMBAR 5- SACRUM 1 TRANSFORAMINAL LUMBAR INTERBODY FUSION AND DECOMPRESSION WITH INSTRUMENTATION AND ALLOGRAFT   Norva Karvonen, MD 03/10/2021 6:33 AM

## 2021-03-10 NOTE — Transfer of Care (Signed)
Immediate Anesthesia Transfer of Care Note  Patient: Jeff Cunningham  Procedure(s) Performed: LEFT LUMBAR 5- SACRUM 1 TRANSFORAMINAL LUMBAR INTERBODY FUSION AND DECOMPRESSION WITH INSTRUMENTATION AND ALLOGRAFT (Left: Spine Lumbar)  Patient Location: PACU  Anesthesia Type:General  Level of Consciousness: drowsy and patient cooperative  Airway & Oxygen Therapy: Patient Spontanous Breathing and Patient connected to face mask oxygen  Post-op Assessment: Report given to RN and Post -op Vital signs reviewed and stable  Post vital signs: Reviewed and stable  Last Vitals:  Vitals Value Taken Time  BP 128/70 03/10/21 1148  Temp    Pulse 71 03/10/21 1149  Resp 15 03/10/21 1149  SpO2 100 % 03/10/21 1149  Vitals shown include unvalidated device data.  Last Pain:  Vitals:   03/10/21 0619  TempSrc:   PainSc: 10-Worst pain ever      Patients Stated Pain Goal: 3 (24/11/46 4314)  Complications: No notable events documented.

## 2021-03-10 NOTE — Op Note (Signed)
PATIENT NAME: Jeff Cunningham   MEDICAL RECORD NO.:   [id number]   DATE OF BIRTH: 08-13-1950  DATE OF PROCEDURE: 03/10/2021                                OPERATIVE REPORT   PREOPERATIVE DIAGNOSES: 1. Left-sided lumbar radiculopathy 2. Recurrent left L5/S1 disc herniation with severe left S1 radiculopathy 3. Status post previous left L5-S1 microdiscectomy in 2019   POSTOPERATIVE DIAGNOSES: 1. Left-sided lumbar radiculopathy 2. Recurrent left L5/S1 disc herniation with severe left S1 radiculopathy 3. Status post previous left L5-S1 microdiscectomy in 2019   PROCEDURES: 1. Left-sided L5-S1 transforaminal lumbar interbody fusion. 2. Right-sided L5-S1 posterolateral fusion. 3. Revision L5/S1 decompression 4. Insertion of interbody device x1 (9 x 27 mm Concorde      intervertebral spacer). 5. Placement of posterior instrumentation at L5, S1 bilaterally. 6. Use of local autograft. 7. Use of morselized allograft - ViviGen. 8. Intraoperative use of fluoroscopy.   SURGEON:  Phylliss Bob, MD.   ASSISTANTPricilla Holm, PA-C.   ANESTHESIA:  General endotracheal anesthesia.   COMPLICATIONS:  None.   DISPOSITION:  Stable.   ESTIMATED BLOOD LOSS:   Minimal   INDICATIONS FOR SURGERY:  Briefly, [name] is a pleasant 71 year old male who did present to me with severe and ongoing pain in the left leg. His pain was thought to be due to the findings noted above. We did have an extensive discussion about surgery, and she did elect to proceed with the procedure noted above.      OPERATIVE DETAILS:  On 03/10/2021, the patient was brought to surgery and general endotracheal anesthesia was administered.  The patient was placed prone on a well-padded flat Jackson bed with a spinal frame.  Antibiotics were given and a time-out procedure was performed. The back was prepped and draped in the usual fashion.  A midline incision was made overlying the L5-S1 intervertebral space.  The  fascia was incised at the midline.  The paraspinal musculature was bluntly swept laterally.  Anatomic landmarks for the pedicles were exposed. Using fluoroscopy, I did cannulate the L5 and S1 pedicles bilaterally, using a medial to lateral cortical trajectory technique.  On the right side, the posterolateral gutter and right facet joint at L5/S1 was decorticated and 6 x 35 mm screws were placed and a 40-mm rod was placed and distraction was applied across the rod on the right side.  On the left side, the cannulated pedicle holes were filled with bone wax.  I then proceeded with the decompressive aspect of the procedure.  On the left side, I did perform a thorough and complete neuroforaminal decompression, with near-complete removal of the left L5-S1 facet joint. I was able to expose the exiting left S1 nerve, which was noted to be rather inflamed, with abundant granulation tissue noted circumferentially about the nerve.  The scar and granulation tissue did extend into the lateral recess and circumferentially around the traversing left S1 nerve as well.  With an assistant holding medial retraction of the traversing left S1 nerve, I was able to identify a very large disc herniation immediately ventral to it, which was removed in multiple fragments.  Of note, this portion of the procedure was extremely meticulous, as the herniation was extremely large, and very adherent to the dura and traversing left S1 nerve.  Carefully meticulously and safely removing the herniated disc fragments did take approximately 60 minutes.  This entirely decompressed the left S1 nerve.  I then performed an annulotomy at the posterolateral aspect of the L5/S1 intervertebral space.  I then used a series of curettes and pituitary rongeurs to perform a thorough and complete intervertebral diskectomy.  The intervertebral space was then liberally packed with autograft as well as allograft in the form of ViviGen, as was the  appropriate-sized intervertebral spacer.  The spacer was then tamped into position in the usual fashion.  I was very pleased with the press-fit of the spacer.  I then placed 6 x 35 mm screws on the left at L5 and S1.  A 40-mm rod was then placed and caps were placed. The distraction was then released on the contralateral right side.  All 4 caps were then locked.  The wound was copiously irrigated with a total of approximately 3 L prior to placing the bone graft.  Additional autograft and allograft were then packed into the posterolateral gutter on the right side to help aid in the L5-S1 fusion.  The wound was explored for any undue bleeding and there was no substantial bleeding encountered.  Gel-Foam was placed over the laminectomy site.  The wound was then closed in layers using #1 Vicryl followed by 2-0 Vicryl, followed by 4-0 Monocryl.  Benzoin and Steri-Strips were applied followed by sterile dressing.   Of note, Pricilla Holm was my assistant throughout surgery, and did aid in retraction, the decompression, placement of the hardware, suctioning, and closure.     Phylliss Bob, MD

## 2021-03-10 NOTE — Anesthesia Procedure Notes (Signed)
Procedure Name: Intubation Date/Time: 03/10/2021 8:10 AM Performed by: Georgia Duff, CRNA Pre-anesthesia Checklist: Patient identified, Emergency Drugs available, Suction available and Patient being monitored Patient Re-evaluated:Patient Re-evaluated prior to induction Oxygen Delivery Method: Circle System Utilized Preoxygenation: Pre-oxygenation with 100% oxygen Induction Type: IV induction Ventilation: Mask ventilation without difficulty Laryngoscope Size: Miller and 2 Grade View: Grade I Tube type: Oral Tube size: 7.5 mm Number of attempts: 1 Airway Equipment and Method: Stylet and Oral airway Placement Confirmation: ETT inserted through vocal cords under direct vision, positive ETCO2 and breath sounds checked- equal and bilateral Secured at: 21 cm Tube secured with: Tape Dental Injury: Teeth and Oropharynx as per pre-operative assessment

## 2021-03-10 NOTE — Anesthesia Postprocedure Evaluation (Signed)
Anesthesia Post Note  Patient: Jeff Cunningham  Procedure(s) Performed: LEFT LUMBAR 5- SACRUM 1 TRANSFORAMINAL LUMBAR INTERBODY FUSION AND DECOMPRESSION WITH INSTRUMENTATION AND ALLOGRAFT (Left: Spine Lumbar)     Patient location during evaluation: PACU Anesthesia Type: General Level of consciousness: awake and alert Pain management: pain level controlled Vital Signs Assessment: post-procedure vital signs reviewed and stable Respiratory status: spontaneous breathing, nonlabored ventilation and respiratory function stable Cardiovascular status: blood pressure returned to baseline and stable Postop Assessment: no apparent nausea or vomiting Anesthetic complications: no   No notable events documented.  Last Vitals:  Vitals:   03/10/21 1303 03/10/21 1329  BP: 133/70 (!) 161/72  Pulse: 68 68  Resp: 15 20  Temp: 36.7 C 36.5 C  SpO2: 94% 99%    Last Pain:  Vitals:   03/10/21 1329  TempSrc: Oral  PainSc:                  Lidia Collum

## 2021-03-11 ENCOUNTER — Encounter (HOSPITAL_COMMUNITY): Payer: Self-pay | Admitting: Orthopedic Surgery

## 2021-03-11 LAB — GLUCOSE, CAPILLARY
Glucose-Capillary: 119 mg/dL — ABNORMAL HIGH (ref 70–99)
Glucose-Capillary: 125 mg/dL — ABNORMAL HIGH (ref 70–99)

## 2021-03-11 MED ORDER — CYCLOBENZAPRINE HCL 10 MG PO TABS: 10.0000 mg | ORAL_TABLET | Freq: Every day | ORAL | 0 refills | Status: AC

## 2021-03-11 MED ORDER — BACLOFEN 10 MG PO TABS
10.0000 mg | ORAL_TABLET | Freq: Two times a day (BID) | ORAL | 0 refills | Status: AC
Start: 2021-03-11 — End: ?

## 2021-03-11 MED ORDER — OXYCODONE-ACETAMINOPHEN 5-325 MG PO TABS: 1.0000 | ORAL_TABLET | ORAL | 0 refills | Status: AC | PRN

## 2021-03-11 NOTE — Progress Notes (Signed)
Patient alert and oriented, mae's well, voiding adequate amount of urine, swallowing without difficulty, no c/o pain at time of discharge. Patient discharged home with family. Script and discharged instructions given to patient. Patient and family stated understanding of instructions given. Patient has an appointment with Dr. Dumonski in 2 weeks 

## 2021-03-11 NOTE — Progress Notes (Signed)
° ° °  Patient doing well postop day 1.  He has been up and walking the halls and has done stairs.  He has had complete resolution of his preoperative left leg pain.  He could not be more pleased.  He feels his pain is well controlled and he is tolerating his current medications well.  He is tolerating his TLSO brace.  He is cleared physical therapy.   Physical Exam: Vitals:   03/11/21 0355 03/11/21 0722  BP: 129/76 129/81  Pulse: 86 77  Resp: 18 18  Temp: 98.2 F (36.8 C) 98.2 F (36.8 C)  SpO2: 100% 96%    Dressing in place, CDI, patient appears comfortable ambulating in the hallways.  He can transition to his bed easily and is resting comfortably at the end of the appointment.  His TLSO brace was worn appropriately.  He has normal mood and affect. NVI  POD #1 s/p revision L5-S1 decompression with fusion, doing excellent with resolved preoperative left leg pain and minimal postoperative low back pain well controlled  - up with PT/OT, encourage ambulation - Percocet for pain, Robaxin for muscle spasms - likely d/c home today with f/u in 2 weeks

## 2021-03-11 NOTE — Evaluation (Signed)
Physical Therapy Evaluation Patient Details Name: Jeff Cunningham MRN: [id number] DOB: May 19, 1950 Today's Date: 03/11/2021  History of Present Illness  71 yo admitted 1/19 for L5-S1 transforaminal fusion. 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 Pt able to stand but with any stepping requires bil UE support as pt states he feels unsteady. PT educated for brace wear and back precautions with handout provided. Pt with support of wife at home with education for activity progression and RW use. Pt with decreased transfers, gait and balance who will benefit from acute therapy to maximize mobility, safety and function.       Recommendations for follow up therapy are one component of a multi-disciplinary discharge planning process, led by the attending physician.  Recommendations may be updated based on patient status, additional functional criteria and insurance authorization.  Follow Up Recommendations Outpatient PT    Assistance Recommended at Discharge Intermittent Supervision/Assistance  Patient can return home with the following  Assistance with cooking/housework;Assist for transportation    Equipment Recommendations None recommended by PT  Recommendations for Other Services       Functional Status Assessment Patient has had a recent decline in their functional status and demonstrates the ability to make significant improvements in function in a reasonable and predictable amount of time.     Precautions / Restrictions Precautions Precautions: Back Precaution Booklet Issued: Yes (comment) Required Braces or Orthoses: Spinal Brace Spinal Brace: Thoracolumbosacral orthotic;Applied in sitting position      Mobility  Bed Mobility Overal bed mobility: Needs Assistance Bed Mobility: Rolling, Sidelying to Sit, Sit to Sidelying Rolling: Supervision Sidelying  to sit: Supervision, HOB elevated     Sit to sidelying: Min guard General bed mobility comments: HOB 15 degrees to rise with pt with adjustable bed at home, cues for sequence and positioning    Transfers Overall transfer level: Modified independent                      Ambulation/Gait Ambulation/Gait assistance: Supervision Gait Distance (Feet): 300 Feet Assistive device: Rolling walker (2 wheels) Gait Pattern/deviations: Step-through pattern, Decreased stride length   Gait velocity interpretation: >2.62 ft/sec, indicative of community ambulatory   General Gait Details: cues for posture and proximity to RW  Stairs Stairs: Yes Stairs assistance: Supervision Stair Management: Step to pattern, Sideways, One rail Left Number of Stairs: 3 General stair comments: pt attempted stairs forward on initial trial but unable to rise. Transitioned to side stepping with left rail with good stability and cues for sequence  Wheelchair Mobility    Modified Rankin (Stroke Patients Only)       Balance Overall balance assessment: Mild deficits observed, not formally tested   Sitting balance-Leahy Scale: Good     Standing balance support: Bilateral upper extremity supported Standing balance-Leahy Scale: Poor Standing balance comment: reliant on rW for gait                             Pertinent Vitals/Pain Pain Assessment Pain Assessment: 0-10 Pain Score: 4  Pain Location: incisional Pain Descriptors / Indicators: Aching, Guarding Pain Intervention(s): Limited activity within patient's tolerance, Monitored during session, Repositioned, Premedicated before session    Home Living Family/patient expects to be discharged to:: Private residence Living Arrangements: Spouse/significant other Available Help at Discharge: Family;Available 24 hours/day Type of Home: [place]  Access: Stairs to enter Entrance Stairs-Rails: Psychiatric nurse of Steps:  4   Home Layout: One level Home Equipment: Conservation officer, nature (2 wheels);Cane - single point;Toilet riser;Shower seat      Prior Function Prior Level of Function : Independent/Modified Independent                     Hand Dominance        Extremity/Trunk Assessment   Upper Extremity Assessment Upper Extremity Assessment: Overall WFL for tasks assessed    Lower Extremity Assessment Lower Extremity Assessment: Overall WFL for tasks assessed    Cervical / Trunk Assessment Cervical / Trunk Assessment: Back Surgery  Communication   Communication: No difficulties  Cognition Arousal/Alertness: Awake/alert Behavior During Therapy: WFL for tasks assessed/performed Overall Cognitive Status: Within Functional Limits for tasks assessed                                          General Comments      Exercises     Assessment/Plan    PT Assessment Patient needs continued PT services  PT Problem List Decreased mobility;Decreased activity tolerance;Decreased balance;Decreased knowledge of use of DME       PT Treatment Interventions DME instruction;Gait training;Balance training;Functional mobility training;Therapeutic activities;Patient/family education    PT Goals (Current goals can be found in the Care Plan section)  Acute Rehab PT Goals Patient Stated Goal: 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, 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) PT Goal Formulation: With patient Time For Goal Achievement: 03/18/21 Potential to Achieve Goals: Good    Frequency Min 5X/week     Co-evaluation               AM-PAC PT "6 Clicks" Mobility  Outcome Measure Help needed turning from your back to your side while in a flat bed without using bedrails?: A Little Help needed moving from lying on your back to sitting on the side of a flat bed without using bedrails?: A Little Help needed moving to and from a bed to a chair (including a wheelchair)?: A Little Help needed  standing up from a chair using your arms (e.g., wheelchair or bedside chair)?: A Little Help needed to walk in hospital room?: A Little Help needed climbing 3-5 steps with a railing? : A Little 6 Click Score: 18    End of Session Equipment Utilized During Treatment: Back brace Activity Tolerance: Patient tolerated treatment well Patient left: in bed;with call bell/phone within reach Nurse Communication: Mobility status PT Visit Diagnosis: Other abnormalities of gait and mobility (R26.89);Difficulty in walking, not elsewhere classified (R26.2)    Time: 0720-0745 PT Time Calculation (min) (ACUTE ONLY): 25 min   Charges:   PT Evaluation $PT Eval Moderate Complexity: 1 Mod PT Treatments $Gait Training: 8-22 mins        Nakeeta Sebastiani P, PT Acute Rehabilitation Services Pager: [phone] Office: [phone]   Sandy Salaam Emberly Tomasso 03/11/2021, 9:39 AM

## 2021-03-11 NOTE — Evaluation (Signed)
Occupational Therapy Evaluation Patient Details Name: Jeff Cunningham MRN: [id number] DOB: 08-Dec-1950 Today's Date: 03/11/2021   History of Present Illness 71 yo admitted 1/19 for L5-S1 transforaminal fusion. PMhx: L5-S1 microdiscectomy, HLD, HTN, depression, DM, gout   Clinical Impression   Pt admitted for procedure listed above. PTA Pt reported that he was independent with all ADL's and IADL's, including doing carpentry work. At this time, pt is limited mildly by pain, however he is able to complete all ADL's and functional mobility independently. Pt is requiring a RW at this time due to pain and balance deficits. He has no further OT needs at this time and acute OT will sign off.       Recommendations for follow up therapy are one component of a multi-disciplinary discharge planning process, led by the attending physician.  Recommendations may be updated based on patient status, additional functional criteria and insurance authorization.   Follow Up Recommendations  No OT follow up    Assistance Recommended at Discharge PRN  Patient can return home with the following A little help with bathing/dressing/bathroom    Functional Status Assessment  Patient has had a recent decline in their functional status and demonstrates the ability to make significant improvements in function in a reasonable and predictable amount of time.  Equipment Recommendations  None recommended by OT    Recommendations for Other Services       Precautions / Restrictions Precautions Precautions: Back Precaution Booklet Issued: Yes (comment) Precaution Comments: Revieewed precautions and compensatory strategies Required Braces or Orthoses: Spinal Brace Spinal Brace: Thoracolumbosacral orthotic;Applied in sitting position Restrictions Weight Bearing Restrictions: No      Mobility Bed Mobility Overal bed mobility: Needs Assistance Bed Mobility: Rolling, Sidelying to Sit, Sit to Sidelying Rolling:  Supervision Sidelying to sit: Supervision, HOB elevated     Sit to sidelying: Min guard General bed mobility comments: HOB 15 degrees to rise with pt with adjustable bed at home, cues for sequence and positioning    Transfers Overall transfer level: Modified independent Equipment used: None                      Balance Overall balance assessment: Mild deficits observed, not formally tested   Sitting balance-Leahy Scale: Good     Standing balance support: Bilateral upper extremity supported Standing balance-Leahy Scale: Poor Standing balance comment: reliant on rW for gait                           ADL either performed or assessed with clinical judgement   ADL Overall ADL's : Modified independent                                       General ADL Comments: Pt able to complete all ADL's with no difficulties     Vision Baseline Vision/History: 0 No visual deficits Ability to See in Adequate Light: 0 Adequate Patient Visual Report: No change from baseline Vision Assessment?: No apparent visual deficits     Perception     Praxis      Pertinent Vitals/Pain Pain Assessment Pain Assessment: 0-10 Pain Score: 4  Pain Location: incisional Pain Descriptors / Indicators: Aching, Guarding Pain Intervention(s): Monitored during session, Repositioned     Hand Dominance Right   Extremity/Trunk Assessment Upper Extremity Assessment Upper Extremity Assessment: Overall WFL for tasks assessed  Lower Extremity Assessment Lower Extremity Assessment: Defer to PT evaluation   Cervical / Trunk Assessment Cervical / Trunk Assessment: Back Surgery   Communication Communication Communication: No difficulties   Cognition Arousal/Alertness: Awake/alert Behavior During Therapy: WFL for tasks assessed/performed Overall Cognitive Status: Within Functional Limits for tasks assessed                                       General  Comments  VSS on RA    Exercises     Shoulder Instructions      Home Living Family/patient expects to be discharged to:: Private residence Living Arrangements: Spouse/significant other Available Help at Discharge: Family;Available 24 hours/day Type of Home: House Home Access: Stairs to enter [employer] of Steps: 4 Entrance Stairs-Rails: Right;Left Home Layout: One level     Bathroom Shower/Tub: Occupational psychologist: Standard Bathroom Accessibility: No   Home Equipment: Conservation officer, nature (2 wheels);Cane - single point;Toilet riser;Shower seat          Prior Functioning/Environment Prior Level of Function : Independent/Modified Independent                        OT Problem List: Decreased strength;Decreased activity tolerance;Impaired balance (sitting and/or standing);Decreased knowledge of use of DME or AE      OT Treatment/Interventions:      OT Goals(Current goals can be found in the care plan section) Acute Rehab OT Goals Patient Stated Goal: To go home OT Goal Formulation: With patient Time For Goal Achievement: 03/11/21 Potential to Achieve Goals: Good  OT Frequency:      Co-evaluation              AM-PAC OT "6 Clicks" Daily Activity     Outcome Measure Help from another person eating meals?: None Help from another person taking care of personal grooming?: None Help from another person toileting, which includes using toliet, bedpan, or urinal?: None Help from another person bathing (including washing, rinsing, drying)?: None Help from another person to put on and taking off regular upper body clothing?: None Help from another person to put on and taking off regular lower body clothing?: None 6 Click Score: 24   End of Session Equipment Utilized During Treatment: Rolling walker (2 wheels) Nurse Communication: Mobility status  Activity Tolerance: Patient tolerated treatment well Patient left: in bed;with call bell/phone  within reach  OT Visit Diagnosis: Unsteadiness on feet (R26.81);Other abnormalities of gait and mobility (R26.89);Muscle weakness (generalized) (M62.81)                Time: 3662-9476 OT Time Calculation (min): 18 min Charges:  OT General Charges $OT Visit: 1 Visit OT Evaluation $OT Eval Moderate Complexity: 1 Mod  Mikle Sternberg H., OTR/L Acute Rehabilitation  Amere Iott Elane Eveleigh Crumpler 03/11/2021, 10:22 AM

## 2021-03-17 NOTE — Discharge Summary (Signed)
Patient ID: Jeff Cunningham MRN: [id number] DOB/AGE: 1950/12/04 71 y.o.  Admit date: 03/10/2021 Discharge date: 03/11/2021  Admission Diagnoses:  Principal Problem:   Radiculopathy   Discharge Diagnoses:  Same  Past Medical History:  Diagnosis Date   Arthritis    Carpal tunnel syndrome    DDD (degenerative disc disease), cervical    Depression    Diabetes mellitus    GERD (gastroesophageal reflux disease)    Gout    Hyperlipidemia    Hypertension    Snores    Wears glasses     Surgeries: Procedure(s): LEFT LUMBAR 5- SACRUM 1 TRANSFORAMINAL LUMBAR INTERBODY FUSION AND DECOMPRESSION WITH INSTRUMENTATION AND ALLOGRAFT on 03/10/2021   Consultants: None  Discharged Condition: Improved  Hospital Course: Jeff Cunningham is an 71 y.o. male who was admitted 03/10/2021 for operative treatment of Radiculopathy. Patient has severe unremitting pain that affects sleep, daily activities, and work/hobbies. After pre-op clearance the patient was taken to the operating room on 03/10/2021 and underwent  Procedure(s): LEFT LUMBAR 5- SACRUM 1 TRANSFORAMINAL LUMBAR INTERBODY FUSION AND DECOMPRESSION WITH INSTRUMENTATION AND ALLOGRAFT.    Patient was given perioperative antibiotics:  Anti-infectives (From admission, onward)    Start     Dose/Rate Route Frequency Ordered Stop   03/10/21 1400  ceFAZolin (ANCEF) IVPB 2g/100 mL premix        2 g 200 mL/hr over 30 Minutes Intravenous Every 8 hours 03/10/21 1310 03/10/21 2146   03/10/21 0600  ceFAZolin (ANCEF) IVPB 2g/100 mL premix        2 g 200 mL/hr over 30 Minutes Intravenous On call to O.R. 03/10/21 0557 03/10/21 0815        Patient was given sequential compression devices, early ambulation to prevent DVT.  Patient benefited maximally from hospital stay and there were no complications.    Recent vital signs: BP 117/72 (BP Location: Right Arm)    Pulse 90    Temp 98.3 F (36.8 C) (Oral)    Resp 18    Ht 5\' 7"  (1.702 m)    Wt 92.5 kg     SpO2 94%    BMI 31.95 kg/m    Discharge Medications:   Allergies as of 03/11/2021       Reactions   Statins    Per pt statin drugs cause muscle aches   Iodine Rash        Medication List     TAKE these medications    allopurinol 300 MG tablet Commonly known as: ZYLOPRIM Take 300 mg by mouth daily.   amLODipine 10 MG tablet Commonly known as: NORVASC Take 10 mg by mouth daily.   baclofen 10 MG tablet Commonly known as: LIORESAL Take 1 tablet (10 mg total) by mouth 2 (two) times daily. Morning and afternoon What changed: additional instructions   cyclobenzaprine 10 MG tablet Commonly known as: FLEXERIL Take 1 tablet (10 mg total) by mouth at bedtime.   losartan 100 MG tablet Commonly known as: COZAAR Take 100 mg by mouth daily.   metFORMIN 500 MG tablet Commonly known as: GLUCOPHAGE Take 500 mg by mouth 2 (two) times daily with a meal.   metoprolol tartrate 25 MG tablet Commonly known as: LOPRESSOR Take 25 mg by mouth 2 (two) times daily.   Narcan 4 MG/0.1ML Liqd nasal spray kit Generic drug: naloxone Place 1 spray into the nose once. accidental opoid overdose   oxyCODONE-acetaminophen 5-325 MG tablet Commonly known as: Roxicet Take 1-2 tablets by mouth every  4 (four) hours as needed. What changed: Another medication with the same name was removed. Continue taking this medication, and follow the directions you see here.   polyethylene glycol powder 17 GM/SCOOP powder Commonly known as: GLYCOLAX/MIRALAX Take 17 g by mouth 2 (two) times a week.   rosuvastatin 20 MG tablet Commonly known as: CRESTOR Take 10 mg by mouth daily.   tadalafil 10 MG tablet Commonly known as: CIALIS Take 10 mg by mouth daily as needed for erectile dysfunction.        Diagnostic Studies: DG Lumbar Spine 2-3 Views  Result Date: 03/10/2021 CLINICAL DATA:  Fluoroscopic assistance for lumbar fusion EXAM: LUMBAR SPINE - 2-3 VIEW COMPARISON:  None. FINDINGS: Fluoroscopic  images show posterior surgical fusion at L5-S1 level. Fluoroscopic time was 66 seconds. Radiation dose is 51.7 mGy. IMPRESSION: Fluoroscopic assistance was provided for lumbar fusion at L5-S1 level. Electronically Signed   By: Elmer Picker M.D.   On: 03/10/2021 12:09   DG Lumbar Spine 1 View  Result Date: 03/10/2021 CLINICAL DATA:  Lateral view of lumbar spine done for intraoperative localization EXAM: LUMBAR SPINE - 1 VIEW COMPARISON:  04/27/2020 FINDINGS: Portable cross-table lateral view of lumbar spine shows linear metallic densities posterior to the spinous processes of L4 and L5 vertebrae. Degenerative changes are noted in the lumbar spine, more severe at L4-L5 and L5-S1 levels. IMPRESSION: Portable cross-table lateral view of lumbar spine was done for intraoperative localization. Electronically Signed   By: Elmer Picker M.D.   On: 03/10/2021 12:08   DG C-Arm 1-60 Min-No Report  Result Date: 03/10/2021 Fluoroscopy was utilized by the requesting physician.  No radiographic interpretation.    Disposition: Discharge disposition: 01-Home or Self Care       Discharge Instructions     Discharge patient   Complete by: As directed    D/C packet printed and in paper chart F/U appt 2 weeks Scripts sent to pharm electronically   Discharge disposition: 01-Home or Self Care   Discharge patient date: 03/11/2021      POD #1 s/p revision L5-S1 decompression with fusion, doing excellent with resolved preoperative left leg pain and minimal postoperative low back pain well controlled   - up with PT/OT, encourage ambulation - Percocet for pain, Robaxin for muscle spasms -Scripts for pain sent to pharmacy electronically  -D/C instructions sheet printed and in chart -D/C today  -F/U in office 2 weeks   Signed: Lennie Muckle Robert Sunga 03/17/2021, 12:50 PM

## 2021-04-01 DIAGNOSIS — M47816 Spondylosis without myelopathy or radiculopathy, lumbar region: Secondary | ICD-10-CM | POA: Diagnosis not present

## 2021-04-01 DIAGNOSIS — M5136 Other intervertebral disc degeneration, lumbar region: Secondary | ICD-10-CM | POA: Diagnosis not present

## 2021-04-01 DIAGNOSIS — M5416 Radiculopathy, lumbar region: Secondary | ICD-10-CM | POA: Diagnosis not present

## 2021-04-01 DIAGNOSIS — Z79891 Long term (current) use of opiate analgesic: Secondary | ICD-10-CM | POA: Diagnosis not present

## 2021-04-01 DIAGNOSIS — M48061 Spinal stenosis, lumbar region without neurogenic claudication: Secondary | ICD-10-CM | POA: Diagnosis not present

## 2021-04-01 DIAGNOSIS — G894 Chronic pain syndrome: Secondary | ICD-10-CM | POA: Diagnosis not present

## 2021-04-01 DIAGNOSIS — M47812 Spondylosis without myelopathy or radiculopathy, cervical region: Secondary | ICD-10-CM | POA: Diagnosis not present

## 2021-04-06 DIAGNOSIS — H401121 Primary open-angle glaucoma, left eye, mild stage: Secondary | ICD-10-CM | POA: Diagnosis not present

## 2021-04-22 DIAGNOSIS — M5416 Radiculopathy, lumbar region: Secondary | ICD-10-CM | POA: Diagnosis not present

## 2021-04-22 DIAGNOSIS — Z9889 Other specified postprocedural states: Secondary | ICD-10-CM | POA: Diagnosis not present

## 2021-04-27 DIAGNOSIS — M79672 Pain in left foot: Secondary | ICD-10-CM | POA: Diagnosis not present

## 2021-04-29 DIAGNOSIS — M48061 Spinal stenosis, lumbar region without neurogenic claudication: Secondary | ICD-10-CM | POA: Diagnosis not present

## 2021-04-29 DIAGNOSIS — M47812 Spondylosis without myelopathy or radiculopathy, cervical region: Secondary | ICD-10-CM | POA: Diagnosis not present

## 2021-04-29 DIAGNOSIS — M5416 Radiculopathy, lumbar region: Secondary | ICD-10-CM | POA: Diagnosis not present

## 2021-04-29 DIAGNOSIS — Z79891 Long term (current) use of opiate analgesic: Secondary | ICD-10-CM | POA: Diagnosis not present

## 2021-04-29 DIAGNOSIS — M5136 Other intervertebral disc degeneration, lumbar region: Secondary | ICD-10-CM | POA: Diagnosis not present

## 2021-04-29 DIAGNOSIS — M47816 Spondylosis without myelopathy or radiculopathy, lumbar region: Secondary | ICD-10-CM | POA: Diagnosis not present

## 2021-05-11 DIAGNOSIS — I10 Essential (primary) hypertension: Secondary | ICD-10-CM | POA: Diagnosis not present

## 2021-05-11 DIAGNOSIS — E7800 Pure hypercholesterolemia, unspecified: Secondary | ICD-10-CM | POA: Diagnosis not present

## 2021-05-11 DIAGNOSIS — E1140 Type 2 diabetes mellitus with diabetic neuropathy, unspecified: Secondary | ICD-10-CM | POA: Diagnosis not present

## 2021-05-30 DIAGNOSIS — M79672 Pain in left foot: Secondary | ICD-10-CM | POA: Diagnosis not present

## 2021-06-03 DIAGNOSIS — M5416 Radiculopathy, lumbar region: Secondary | ICD-10-CM | POA: Diagnosis not present

## 2021-06-03 DIAGNOSIS — M47816 Spondylosis without myelopathy or radiculopathy, lumbar region: Secondary | ICD-10-CM | POA: Diagnosis not present

## 2021-06-03 DIAGNOSIS — M48061 Spinal stenosis, lumbar region without neurogenic claudication: Secondary | ICD-10-CM | POA: Diagnosis not present

## 2021-06-03 DIAGNOSIS — M47812 Spondylosis without myelopathy or radiculopathy, cervical region: Secondary | ICD-10-CM | POA: Diagnosis not present

## 2021-06-03 DIAGNOSIS — M5136 Other intervertebral disc degeneration, lumbar region: Secondary | ICD-10-CM | POA: Diagnosis not present

## 2021-06-03 DIAGNOSIS — Z79891 Long term (current) use of opiate analgesic: Secondary | ICD-10-CM | POA: Diagnosis not present

## 2021-06-04 DIAGNOSIS — M79672 Pain in left foot: Secondary | ICD-10-CM | POA: Diagnosis not present

## 2021-06-06 DIAGNOSIS — M5416 Radiculopathy, lumbar region: Secondary | ICD-10-CM | POA: Diagnosis not present

## 2021-06-06 DIAGNOSIS — Z9889 Other specified postprocedural states: Secondary | ICD-10-CM | POA: Diagnosis not present

## 2021-06-10 DIAGNOSIS — M79672 Pain in left foot: Secondary | ICD-10-CM | POA: Diagnosis not present

## 2021-06-24 DIAGNOSIS — R252 Cramp and spasm: Secondary | ICD-10-CM | POA: Diagnosis not present

## 2021-06-24 DIAGNOSIS — E1140 Type 2 diabetes mellitus with diabetic neuropathy, unspecified: Secondary | ICD-10-CM | POA: Diagnosis not present

## 2021-06-24 DIAGNOSIS — I10 Essential (primary) hypertension: Secondary | ICD-10-CM | POA: Diagnosis not present

## 2021-06-24 DIAGNOSIS — Z0000 Encounter for general adult medical examination without abnormal findings: Secondary | ICD-10-CM | POA: Diagnosis not present

## 2021-06-24 DIAGNOSIS — E7800 Pure hypercholesterolemia, unspecified: Secondary | ICD-10-CM | POA: Diagnosis not present

## 2021-06-24 DIAGNOSIS — M109 Gout, unspecified: Secondary | ICD-10-CM | POA: Diagnosis not present

## 2021-07-01 DIAGNOSIS — M47816 Spondylosis without myelopathy or radiculopathy, lumbar region: Secondary | ICD-10-CM | POA: Diagnosis not present

## 2021-07-01 DIAGNOSIS — M5416 Radiculopathy, lumbar region: Secondary | ICD-10-CM | POA: Diagnosis not present

## 2021-07-01 DIAGNOSIS — M48061 Spinal stenosis, lumbar region without neurogenic claudication: Secondary | ICD-10-CM | POA: Diagnosis not present

## 2021-07-01 DIAGNOSIS — M47812 Spondylosis without myelopathy or radiculopathy, cervical region: Secondary | ICD-10-CM | POA: Diagnosis not present

## 2021-07-01 DIAGNOSIS — M5136 Other intervertebral disc degeneration, lumbar region: Secondary | ICD-10-CM | POA: Diagnosis not present

## 2021-07-01 DIAGNOSIS — Z79891 Long term (current) use of opiate analgesic: Secondary | ICD-10-CM | POA: Diagnosis not present

## 2021-07-29 DIAGNOSIS — M47812 Spondylosis without myelopathy or radiculopathy, cervical region: Secondary | ICD-10-CM | POA: Diagnosis not present

## 2021-07-29 DIAGNOSIS — M5136 Other intervertebral disc degeneration, lumbar region: Secondary | ICD-10-CM | POA: Diagnosis not present

## 2021-07-29 DIAGNOSIS — Z79891 Long term (current) use of opiate analgesic: Secondary | ICD-10-CM | POA: Diagnosis not present

## 2021-07-29 DIAGNOSIS — M5416 Radiculopathy, lumbar region: Secondary | ICD-10-CM | POA: Diagnosis not present

## 2021-07-29 DIAGNOSIS — M47816 Spondylosis without myelopathy or radiculopathy, lumbar region: Secondary | ICD-10-CM | POA: Diagnosis not present

## 2021-07-29 DIAGNOSIS — M48061 Spinal stenosis, lumbar region without neurogenic claudication: Secondary | ICD-10-CM | POA: Diagnosis not present

## 2021-09-07 DIAGNOSIS — E1140 Type 2 diabetes mellitus with diabetic neuropathy, unspecified: Secondary | ICD-10-CM | POA: Diagnosis not present

## 2021-09-07 DIAGNOSIS — I10 Essential (primary) hypertension: Secondary | ICD-10-CM | POA: Diagnosis not present

## 2021-09-07 DIAGNOSIS — E7800 Pure hypercholesterolemia, unspecified: Secondary | ICD-10-CM | POA: Diagnosis not present

## 2021-09-09 DIAGNOSIS — G894 Chronic pain syndrome: Secondary | ICD-10-CM | POA: Diagnosis not present

## 2021-09-09 DIAGNOSIS — M47816 Spondylosis without myelopathy or radiculopathy, lumbar region: Secondary | ICD-10-CM | POA: Diagnosis not present

## 2021-09-09 DIAGNOSIS — M48061 Spinal stenosis, lumbar region without neurogenic claudication: Secondary | ICD-10-CM | POA: Diagnosis not present

## 2021-09-09 DIAGNOSIS — M5136 Other intervertebral disc degeneration, lumbar region: Secondary | ICD-10-CM | POA: Diagnosis not present

## 2021-09-09 DIAGNOSIS — M47812 Spondylosis without myelopathy or radiculopathy, cervical region: Secondary | ICD-10-CM | POA: Diagnosis not present

## 2021-09-09 DIAGNOSIS — Z79891 Long term (current) use of opiate analgesic: Secondary | ICD-10-CM | POA: Diagnosis not present

## 2021-09-09 DIAGNOSIS — M5416 Radiculopathy, lumbar region: Secondary | ICD-10-CM | POA: Diagnosis not present

## 2021-10-11 DIAGNOSIS — M47812 Spondylosis without myelopathy or radiculopathy, cervical region: Secondary | ICD-10-CM | POA: Diagnosis not present

## 2021-10-11 DIAGNOSIS — M48061 Spinal stenosis, lumbar region without neurogenic claudication: Secondary | ICD-10-CM | POA: Diagnosis not present

## 2021-10-11 DIAGNOSIS — M47816 Spondylosis without myelopathy or radiculopathy, lumbar region: Secondary | ICD-10-CM | POA: Diagnosis not present

## 2021-10-11 DIAGNOSIS — G894 Chronic pain syndrome: Secondary | ICD-10-CM | POA: Diagnosis not present

## 2021-10-11 DIAGNOSIS — Z79891 Long term (current) use of opiate analgesic: Secondary | ICD-10-CM | POA: Diagnosis not present

## 2021-10-11 DIAGNOSIS — M5416 Radiculopathy, lumbar region: Secondary | ICD-10-CM | POA: Diagnosis not present

## 2021-10-11 DIAGNOSIS — M5136 Other intervertebral disc degeneration, lumbar region: Secondary | ICD-10-CM | POA: Diagnosis not present

## 2021-11-08 DIAGNOSIS — E1140 Type 2 diabetes mellitus with diabetic neuropathy, unspecified: Secondary | ICD-10-CM | POA: Diagnosis not present

## 2021-11-08 DIAGNOSIS — I10 Essential (primary) hypertension: Secondary | ICD-10-CM | POA: Diagnosis not present

## 2021-11-08 DIAGNOSIS — E7800 Pure hypercholesterolemia, unspecified: Secondary | ICD-10-CM | POA: Diagnosis not present

## 2021-11-11 DIAGNOSIS — M47816 Spondylosis without myelopathy or radiculopathy, lumbar region: Secondary | ICD-10-CM | POA: Diagnosis not present

## 2021-11-11 DIAGNOSIS — Z79891 Long term (current) use of opiate analgesic: Secondary | ICD-10-CM | POA: Diagnosis not present

## 2021-11-11 DIAGNOSIS — M47812 Spondylosis without myelopathy or radiculopathy, cervical region: Secondary | ICD-10-CM | POA: Diagnosis not present

## 2021-11-11 DIAGNOSIS — H401121 Primary open-angle glaucoma, left eye, mild stage: Secondary | ICD-10-CM | POA: Diagnosis not present

## 2021-11-11 DIAGNOSIS — M5416 Radiculopathy, lumbar region: Secondary | ICD-10-CM | POA: Diagnosis not present

## 2021-11-11 DIAGNOSIS — M48061 Spinal stenosis, lumbar region without neurogenic claudication: Secondary | ICD-10-CM | POA: Diagnosis not present

## 2021-11-11 DIAGNOSIS — M5136 Other intervertebral disc degeneration, lumbar region: Secondary | ICD-10-CM | POA: Diagnosis not present

## 2021-12-09 DIAGNOSIS — M5416 Radiculopathy, lumbar region: Secondary | ICD-10-CM | POA: Diagnosis not present

## 2021-12-09 DIAGNOSIS — M47812 Spondylosis without myelopathy or radiculopathy, cervical region: Secondary | ICD-10-CM | POA: Diagnosis not present

## 2021-12-09 DIAGNOSIS — M5136 Other intervertebral disc degeneration, lumbar region: Secondary | ICD-10-CM | POA: Diagnosis not present

## 2021-12-09 DIAGNOSIS — Z79891 Long term (current) use of opiate analgesic: Secondary | ICD-10-CM | POA: Diagnosis not present

## 2021-12-09 DIAGNOSIS — M48061 Spinal stenosis, lumbar region without neurogenic claudication: Secondary | ICD-10-CM | POA: Diagnosis not present

## 2021-12-09 DIAGNOSIS — M47816 Spondylosis without myelopathy or radiculopathy, lumbar region: Secondary | ICD-10-CM | POA: Diagnosis not present

## 2021-12-13 ENCOUNTER — Encounter: Payer: Self-pay | Admitting: Gastroenterology

## 2022-01-04 DIAGNOSIS — I10 Essential (primary) hypertension: Secondary | ICD-10-CM | POA: Diagnosis not present

## 2022-01-04 DIAGNOSIS — E559 Vitamin D deficiency, unspecified: Secondary | ICD-10-CM | POA: Diagnosis not present

## 2022-01-04 DIAGNOSIS — M109 Gout, unspecified: Secondary | ICD-10-CM | POA: Diagnosis not present

## 2022-01-04 DIAGNOSIS — E7800 Pure hypercholesterolemia, unspecified: Secondary | ICD-10-CM | POA: Diagnosis not present

## 2022-01-04 DIAGNOSIS — E1140 Type 2 diabetes mellitus with diabetic neuropathy, unspecified: Secondary | ICD-10-CM | POA: Diagnosis not present

## 2022-01-04 DIAGNOSIS — M2550 Pain in unspecified joint: Secondary | ICD-10-CM | POA: Diagnosis not present

## 2022-01-06 DIAGNOSIS — M5136 Other intervertebral disc degeneration, lumbar region: Secondary | ICD-10-CM | POA: Diagnosis not present

## 2022-01-06 DIAGNOSIS — M48061 Spinal stenosis, lumbar region without neurogenic claudication: Secondary | ICD-10-CM | POA: Diagnosis not present

## 2022-01-06 DIAGNOSIS — Z79891 Long term (current) use of opiate analgesic: Secondary | ICD-10-CM | POA: Diagnosis not present

## 2022-01-06 DIAGNOSIS — M47816 Spondylosis without myelopathy or radiculopathy, lumbar region: Secondary | ICD-10-CM | POA: Diagnosis not present

## 2022-01-06 DIAGNOSIS — M5416 Radiculopathy, lumbar region: Secondary | ICD-10-CM | POA: Diagnosis not present

## 2022-01-06 DIAGNOSIS — M47812 Spondylosis without myelopathy or radiculopathy, cervical region: Secondary | ICD-10-CM | POA: Diagnosis not present

## 2022-01-21 DIAGNOSIS — R1032 Left lower quadrant pain: Secondary | ICD-10-CM | POA: Diagnosis not present

## 2022-01-24 DIAGNOSIS — R1032 Left lower quadrant pain: Secondary | ICD-10-CM | POA: Diagnosis not present

## 2022-02-03 DIAGNOSIS — M5416 Radiculopathy, lumbar region: Secondary | ICD-10-CM | POA: Diagnosis not present

## 2022-02-03 DIAGNOSIS — M47812 Spondylosis without myelopathy or radiculopathy, cervical region: Secondary | ICD-10-CM | POA: Diagnosis not present

## 2022-02-03 DIAGNOSIS — M47816 Spondylosis without myelopathy or radiculopathy, lumbar region: Secondary | ICD-10-CM | POA: Diagnosis not present

## 2022-02-03 DIAGNOSIS — M48061 Spinal stenosis, lumbar region without neurogenic claudication: Secondary | ICD-10-CM | POA: Diagnosis not present

## 2022-02-03 DIAGNOSIS — Z79891 Long term (current) use of opiate analgesic: Secondary | ICD-10-CM | POA: Diagnosis not present

## 2022-02-03 DIAGNOSIS — M5136 Other intervertebral disc degeneration, lumbar region: Secondary | ICD-10-CM | POA: Diagnosis not present

## 2022-03-09 DIAGNOSIS — M47812 Spondylosis without myelopathy or radiculopathy, cervical region: Secondary | ICD-10-CM | POA: Diagnosis not present

## 2022-03-09 DIAGNOSIS — M48061 Spinal stenosis, lumbar region without neurogenic claudication: Secondary | ICD-10-CM | POA: Diagnosis not present

## 2022-03-09 DIAGNOSIS — Z79891 Long term (current) use of opiate analgesic: Secondary | ICD-10-CM | POA: Diagnosis not present

## 2022-03-09 DIAGNOSIS — M5416 Radiculopathy, lumbar region: Secondary | ICD-10-CM | POA: Diagnosis not present

## 2022-03-09 DIAGNOSIS — M5136 Other intervertebral disc degeneration, lumbar region: Secondary | ICD-10-CM | POA: Diagnosis not present

## 2022-03-09 DIAGNOSIS — M47816 Spondylosis without myelopathy or radiculopathy, lumbar region: Secondary | ICD-10-CM | POA: Diagnosis not present

## 2022-04-07 DIAGNOSIS — M47816 Spondylosis without myelopathy or radiculopathy, lumbar region: Secondary | ICD-10-CM | POA: Diagnosis not present

## 2022-04-07 DIAGNOSIS — M5136 Other intervertebral disc degeneration, lumbar region: Secondary | ICD-10-CM | POA: Diagnosis not present

## 2022-04-07 DIAGNOSIS — E1140 Type 2 diabetes mellitus with diabetic neuropathy, unspecified: Secondary | ICD-10-CM | POA: Diagnosis not present

## 2022-04-07 DIAGNOSIS — G894 Chronic pain syndrome: Secondary | ICD-10-CM | POA: Diagnosis not present

## 2022-04-07 DIAGNOSIS — Z79891 Long term (current) use of opiate analgesic: Secondary | ICD-10-CM | POA: Diagnosis not present

## 2022-04-07 DIAGNOSIS — E7800 Pure hypercholesterolemia, unspecified: Secondary | ICD-10-CM | POA: Diagnosis not present

## 2022-04-07 DIAGNOSIS — M48061 Spinal stenosis, lumbar region without neurogenic claudication: Secondary | ICD-10-CM | POA: Diagnosis not present

## 2022-04-07 DIAGNOSIS — M47812 Spondylosis without myelopathy or radiculopathy, cervical region: Secondary | ICD-10-CM | POA: Diagnosis not present

## 2022-04-07 DIAGNOSIS — I10 Essential (primary) hypertension: Secondary | ICD-10-CM | POA: Diagnosis not present

## 2022-04-07 DIAGNOSIS — M5416 Radiculopathy, lumbar region: Secondary | ICD-10-CM | POA: Diagnosis not present

## 2022-04-07 DIAGNOSIS — M1990 Unspecified osteoarthritis, unspecified site: Secondary | ICD-10-CM | POA: Diagnosis not present

## 2022-05-05 DIAGNOSIS — M47816 Spondylosis without myelopathy or radiculopathy, lumbar region: Secondary | ICD-10-CM | POA: Diagnosis not present

## 2022-05-05 DIAGNOSIS — M48061 Spinal stenosis, lumbar region without neurogenic claudication: Secondary | ICD-10-CM | POA: Diagnosis not present

## 2022-05-05 DIAGNOSIS — M47812 Spondylosis without myelopathy or radiculopathy, cervical region: Secondary | ICD-10-CM | POA: Diagnosis not present

## 2022-05-05 DIAGNOSIS — M1990 Unspecified osteoarthritis, unspecified site: Secondary | ICD-10-CM | POA: Diagnosis not present

## 2022-05-05 DIAGNOSIS — E7800 Pure hypercholesterolemia, unspecified: Secondary | ICD-10-CM | POA: Diagnosis not present

## 2022-05-05 DIAGNOSIS — E1140 Type 2 diabetes mellitus with diabetic neuropathy, unspecified: Secondary | ICD-10-CM | POA: Diagnosis not present

## 2022-05-05 DIAGNOSIS — I10 Essential (primary) hypertension: Secondary | ICD-10-CM | POA: Diagnosis not present

## 2022-05-05 DIAGNOSIS — M5136 Other intervertebral disc degeneration, lumbar region: Secondary | ICD-10-CM | POA: Diagnosis not present

## 2022-05-05 DIAGNOSIS — Z79891 Long term (current) use of opiate analgesic: Secondary | ICD-10-CM | POA: Diagnosis not present

## 2022-05-05 DIAGNOSIS — M5416 Radiculopathy, lumbar region: Secondary | ICD-10-CM | POA: Diagnosis not present

## 2022-05-12 DIAGNOSIS — H401121 Primary open-angle glaucoma, left eye, mild stage: Secondary | ICD-10-CM | POA: Diagnosis not present

## 2022-06-05 DIAGNOSIS — M5136 Other intervertebral disc degeneration, lumbar region: Secondary | ICD-10-CM | POA: Diagnosis not present

## 2022-06-05 DIAGNOSIS — M5416 Radiculopathy, lumbar region: Secondary | ICD-10-CM | POA: Diagnosis not present

## 2022-06-05 DIAGNOSIS — M48061 Spinal stenosis, lumbar region without neurogenic claudication: Secondary | ICD-10-CM | POA: Diagnosis not present

## 2022-06-05 DIAGNOSIS — M47812 Spondylosis without myelopathy or radiculopathy, cervical region: Secondary | ICD-10-CM | POA: Diagnosis not present

## 2022-06-05 DIAGNOSIS — M47816 Spondylosis without myelopathy or radiculopathy, lumbar region: Secondary | ICD-10-CM | POA: Diagnosis not present

## 2022-06-05 DIAGNOSIS — Z79891 Long term (current) use of opiate analgesic: Secondary | ICD-10-CM | POA: Diagnosis not present

## 2022-06-27 DIAGNOSIS — Z0000 Encounter for general adult medical examination without abnormal findings: Secondary | ICD-10-CM | POA: Diagnosis not present

## 2022-07-04 DIAGNOSIS — I10 Essential (primary) hypertension: Secondary | ICD-10-CM | POA: Diagnosis not present

## 2022-07-04 DIAGNOSIS — M109 Gout, unspecified: Secondary | ICD-10-CM | POA: Diagnosis not present

## 2022-07-04 DIAGNOSIS — M1990 Unspecified osteoarthritis, unspecified site: Secondary | ICD-10-CM | POA: Diagnosis not present

## 2022-07-04 DIAGNOSIS — E7800 Pure hypercholesterolemia, unspecified: Secondary | ICD-10-CM | POA: Diagnosis not present

## 2022-07-04 DIAGNOSIS — E1169 Type 2 diabetes mellitus with other specified complication: Secondary | ICD-10-CM | POA: Diagnosis not present

## 2022-07-04 DIAGNOSIS — E559 Vitamin D deficiency, unspecified: Secondary | ICD-10-CM | POA: Diagnosis not present

## 2022-07-04 DIAGNOSIS — E119 Type 2 diabetes mellitus without complications: Secondary | ICD-10-CM | POA: Diagnosis not present

## 2022-07-05 DIAGNOSIS — M5416 Radiculopathy, lumbar region: Secondary | ICD-10-CM | POA: Diagnosis not present

## 2022-07-05 DIAGNOSIS — M5136 Other intervertebral disc degeneration, lumbar region: Secondary | ICD-10-CM | POA: Diagnosis not present

## 2022-07-05 DIAGNOSIS — Z79891 Long term (current) use of opiate analgesic: Secondary | ICD-10-CM | POA: Diagnosis not present

## 2022-07-05 DIAGNOSIS — M47812 Spondylosis without myelopathy or radiculopathy, cervical region: Secondary | ICD-10-CM | POA: Diagnosis not present

## 2022-07-05 DIAGNOSIS — M47816 Spondylosis without myelopathy or radiculopathy, lumbar region: Secondary | ICD-10-CM | POA: Diagnosis not present

## 2022-07-05 DIAGNOSIS — G894 Chronic pain syndrome: Secondary | ICD-10-CM | POA: Diagnosis not present

## 2022-07-05 DIAGNOSIS — M48061 Spinal stenosis, lumbar region without neurogenic claudication: Secondary | ICD-10-CM | POA: Diagnosis not present

## 2022-07-31 DIAGNOSIS — M79672 Pain in left foot: Secondary | ICD-10-CM | POA: Diagnosis not present

## 2022-08-02 DIAGNOSIS — M5416 Radiculopathy, lumbar region: Secondary | ICD-10-CM | POA: Diagnosis not present

## 2022-08-02 DIAGNOSIS — Z79891 Long term (current) use of opiate analgesic: Secondary | ICD-10-CM | POA: Diagnosis not present

## 2022-08-02 DIAGNOSIS — M48061 Spinal stenosis, lumbar region without neurogenic claudication: Secondary | ICD-10-CM | POA: Diagnosis not present

## 2022-08-02 DIAGNOSIS — M5136 Other intervertebral disc degeneration, lumbar region: Secondary | ICD-10-CM | POA: Diagnosis not present

## 2022-08-02 DIAGNOSIS — M47812 Spondylosis without myelopathy or radiculopathy, cervical region: Secondary | ICD-10-CM | POA: Diagnosis not present

## 2022-08-02 DIAGNOSIS — M47816 Spondylosis without myelopathy or radiculopathy, lumbar region: Secondary | ICD-10-CM | POA: Diagnosis not present

## 2022-08-03 DIAGNOSIS — E1140 Type 2 diabetes mellitus with diabetic neuropathy, unspecified: Secondary | ICD-10-CM | POA: Diagnosis not present

## 2022-08-03 DIAGNOSIS — M1990 Unspecified osteoarthritis, unspecified site: Secondary | ICD-10-CM | POA: Diagnosis not present

## 2022-08-03 DIAGNOSIS — M47816 Spondylosis without myelopathy or radiculopathy, lumbar region: Secondary | ICD-10-CM | POA: Diagnosis not present

## 2022-08-03 DIAGNOSIS — E7800 Pure hypercholesterolemia, unspecified: Secondary | ICD-10-CM | POA: Diagnosis not present

## 2022-08-03 DIAGNOSIS — I10 Essential (primary) hypertension: Secondary | ICD-10-CM | POA: Diagnosis not present

## 2022-08-21 DIAGNOSIS — Z79891 Long term (current) use of opiate analgesic: Secondary | ICD-10-CM | POA: Diagnosis not present

## 2022-08-21 DIAGNOSIS — M47816 Spondylosis without myelopathy or radiculopathy, lumbar region: Secondary | ICD-10-CM | POA: Diagnosis not present

## 2022-08-21 DIAGNOSIS — M48061 Spinal stenosis, lumbar region without neurogenic claudication: Secondary | ICD-10-CM | POA: Diagnosis not present

## 2022-08-21 DIAGNOSIS — M5416 Radiculopathy, lumbar region: Secondary | ICD-10-CM | POA: Diagnosis not present

## 2022-08-21 DIAGNOSIS — M47812 Spondylosis without myelopathy or radiculopathy, cervical region: Secondary | ICD-10-CM | POA: Diagnosis not present

## 2022-08-21 DIAGNOSIS — M5136 Other intervertebral disc degeneration, lumbar region: Secondary | ICD-10-CM | POA: Diagnosis not present

## 2022-08-22 DIAGNOSIS — G629 Polyneuropathy, unspecified: Secondary | ICD-10-CM | POA: Diagnosis not present

## 2022-09-01 DIAGNOSIS — M79672 Pain in left foot: Secondary | ICD-10-CM | POA: Diagnosis not present

## 2022-09-05 DIAGNOSIS — M1990 Unspecified osteoarthritis, unspecified site: Secondary | ICD-10-CM | POA: Diagnosis not present

## 2022-09-05 DIAGNOSIS — E1140 Type 2 diabetes mellitus with diabetic neuropathy, unspecified: Secondary | ICD-10-CM | POA: Diagnosis not present

## 2022-09-05 DIAGNOSIS — M47816 Spondylosis without myelopathy or radiculopathy, lumbar region: Secondary | ICD-10-CM | POA: Diagnosis not present

## 2022-09-05 DIAGNOSIS — I10 Essential (primary) hypertension: Secondary | ICD-10-CM | POA: Diagnosis not present

## 2022-09-05 DIAGNOSIS — E7800 Pure hypercholesterolemia, unspecified: Secondary | ICD-10-CM | POA: Diagnosis not present

## 2022-09-27 DIAGNOSIS — M5136 Other intervertebral disc degeneration, lumbar region: Secondary | ICD-10-CM | POA: Diagnosis not present

## 2022-09-27 DIAGNOSIS — M5416 Radiculopathy, lumbar region: Secondary | ICD-10-CM | POA: Diagnosis not present

## 2022-09-27 DIAGNOSIS — M47816 Spondylosis without myelopathy or radiculopathy, lumbar region: Secondary | ICD-10-CM | POA: Diagnosis not present

## 2022-09-27 DIAGNOSIS — G894 Chronic pain syndrome: Secondary | ICD-10-CM | POA: Diagnosis not present

## 2022-09-27 DIAGNOSIS — Z9889 Other specified postprocedural states: Secondary | ICD-10-CM | POA: Diagnosis not present

## 2022-09-27 DIAGNOSIS — Z79891 Long term (current) use of opiate analgesic: Secondary | ICD-10-CM | POA: Diagnosis not present

## 2022-09-27 DIAGNOSIS — M47812 Spondylosis without myelopathy or radiculopathy, cervical region: Secondary | ICD-10-CM | POA: Diagnosis not present

## 2022-09-27 DIAGNOSIS — M48061 Spinal stenosis, lumbar region without neurogenic claudication: Secondary | ICD-10-CM | POA: Diagnosis not present

## 2022-11-01 DIAGNOSIS — M47812 Spondylosis without myelopathy or radiculopathy, cervical region: Secondary | ICD-10-CM | POA: Diagnosis not present

## 2022-11-01 DIAGNOSIS — Z79891 Long term (current) use of opiate analgesic: Secondary | ICD-10-CM | POA: Diagnosis not present

## 2022-11-01 DIAGNOSIS — M5136 Other intervertebral disc degeneration, lumbar region: Secondary | ICD-10-CM | POA: Diagnosis not present

## 2022-11-01 DIAGNOSIS — M47816 Spondylosis without myelopathy or radiculopathy, lumbar region: Secondary | ICD-10-CM | POA: Diagnosis not present

## 2022-11-01 DIAGNOSIS — M5416 Radiculopathy, lumbar region: Secondary | ICD-10-CM | POA: Diagnosis not present

## 2022-11-01 DIAGNOSIS — Z9889 Other specified postprocedural states: Secondary | ICD-10-CM | POA: Diagnosis not present

## 2022-11-01 DIAGNOSIS — M48061 Spinal stenosis, lumbar region without neurogenic claudication: Secondary | ICD-10-CM | POA: Diagnosis not present

## 2022-11-29 DIAGNOSIS — M5416 Radiculopathy, lumbar region: Secondary | ICD-10-CM | POA: Diagnosis not present

## 2022-11-29 DIAGNOSIS — Z79891 Long term (current) use of opiate analgesic: Secondary | ICD-10-CM | POA: Diagnosis not present

## 2022-11-29 DIAGNOSIS — Z1389 Encounter for screening for other disorder: Secondary | ICD-10-CM | POA: Diagnosis not present

## 2022-11-29 DIAGNOSIS — M47812 Spondylosis without myelopathy or radiculopathy, cervical region: Secondary | ICD-10-CM | POA: Diagnosis not present

## 2022-11-29 DIAGNOSIS — M48061 Spinal stenosis, lumbar region without neurogenic claudication: Secondary | ICD-10-CM | POA: Diagnosis not present

## 2022-11-29 DIAGNOSIS — M47816 Spondylosis without myelopathy or radiculopathy, lumbar region: Secondary | ICD-10-CM | POA: Diagnosis not present

## 2022-12-06 DIAGNOSIS — H40013 Open angle with borderline findings, low risk, bilateral: Secondary | ICD-10-CM | POA: Diagnosis not present

## 2022-12-27 DIAGNOSIS — M48061 Spinal stenosis, lumbar region without neurogenic claudication: Secondary | ICD-10-CM | POA: Diagnosis not present

## 2022-12-27 DIAGNOSIS — M47816 Spondylosis without myelopathy or radiculopathy, lumbar region: Secondary | ICD-10-CM | POA: Diagnosis not present

## 2022-12-27 DIAGNOSIS — Z9889 Other specified postprocedural states: Secondary | ICD-10-CM | POA: Diagnosis not present

## 2022-12-27 DIAGNOSIS — M5416 Radiculopathy, lumbar region: Secondary | ICD-10-CM | POA: Diagnosis not present

## 2022-12-27 DIAGNOSIS — M47812 Spondylosis without myelopathy or radiculopathy, cervical region: Secondary | ICD-10-CM | POA: Diagnosis not present

## 2022-12-27 DIAGNOSIS — Z79891 Long term (current) use of opiate analgesic: Secondary | ICD-10-CM | POA: Diagnosis not present

## 2023-01-10 DIAGNOSIS — E559 Vitamin D deficiency, unspecified: Secondary | ICD-10-CM | POA: Diagnosis not present

## 2023-01-10 DIAGNOSIS — M109 Gout, unspecified: Secondary | ICD-10-CM | POA: Diagnosis not present

## 2023-01-10 DIAGNOSIS — E7800 Pure hypercholesterolemia, unspecified: Secondary | ICD-10-CM | POA: Diagnosis not present

## 2023-01-10 DIAGNOSIS — I10 Essential (primary) hypertension: Secondary | ICD-10-CM | POA: Diagnosis not present

## 2023-01-10 DIAGNOSIS — E1140 Type 2 diabetes mellitus with diabetic neuropathy, unspecified: Secondary | ICD-10-CM | POA: Diagnosis not present

## 2023-01-24 DIAGNOSIS — M48061 Spinal stenosis, lumbar region without neurogenic claudication: Secondary | ICD-10-CM | POA: Diagnosis not present

## 2023-01-24 DIAGNOSIS — M47812 Spondylosis without myelopathy or radiculopathy, cervical region: Secondary | ICD-10-CM | POA: Diagnosis not present

## 2023-01-24 DIAGNOSIS — Z79891 Long term (current) use of opiate analgesic: Secondary | ICD-10-CM | POA: Diagnosis not present

## 2023-01-24 DIAGNOSIS — Z9889 Other specified postprocedural states: Secondary | ICD-10-CM | POA: Diagnosis not present

## 2023-01-24 DIAGNOSIS — M5416 Radiculopathy, lumbar region: Secondary | ICD-10-CM | POA: Diagnosis not present

## 2023-01-24 DIAGNOSIS — M47816 Spondylosis without myelopathy or radiculopathy, lumbar region: Secondary | ICD-10-CM | POA: Diagnosis not present

## 2023-03-07 DIAGNOSIS — M48061 Spinal stenosis, lumbar region without neurogenic claudication: Secondary | ICD-10-CM | POA: Diagnosis not present

## 2023-03-07 DIAGNOSIS — Z9889 Other specified postprocedural states: Secondary | ICD-10-CM | POA: Diagnosis not present

## 2023-03-07 DIAGNOSIS — M47812 Spondylosis without myelopathy or radiculopathy, cervical region: Secondary | ICD-10-CM | POA: Diagnosis not present

## 2023-03-07 DIAGNOSIS — M47816 Spondylosis without myelopathy or radiculopathy, lumbar region: Secondary | ICD-10-CM | POA: Diagnosis not present

## 2023-03-07 DIAGNOSIS — Z79891 Long term (current) use of opiate analgesic: Secondary | ICD-10-CM | POA: Diagnosis not present

## 2023-03-07 DIAGNOSIS — M5416 Radiculopathy, lumbar region: Secondary | ICD-10-CM | POA: Diagnosis not present

## 2023-03-19 IMAGING — CR DG LUMBAR SPINE 1V
1 series · 1 of 1 positions shown · non-contrast
Comparison: 04/27/2020

CLINICAL DATA: Lateral view of lumbar spine done for intraoperative
localization

EXAM:
LUMBAR SPINE - 1 VIEW

[lateral]
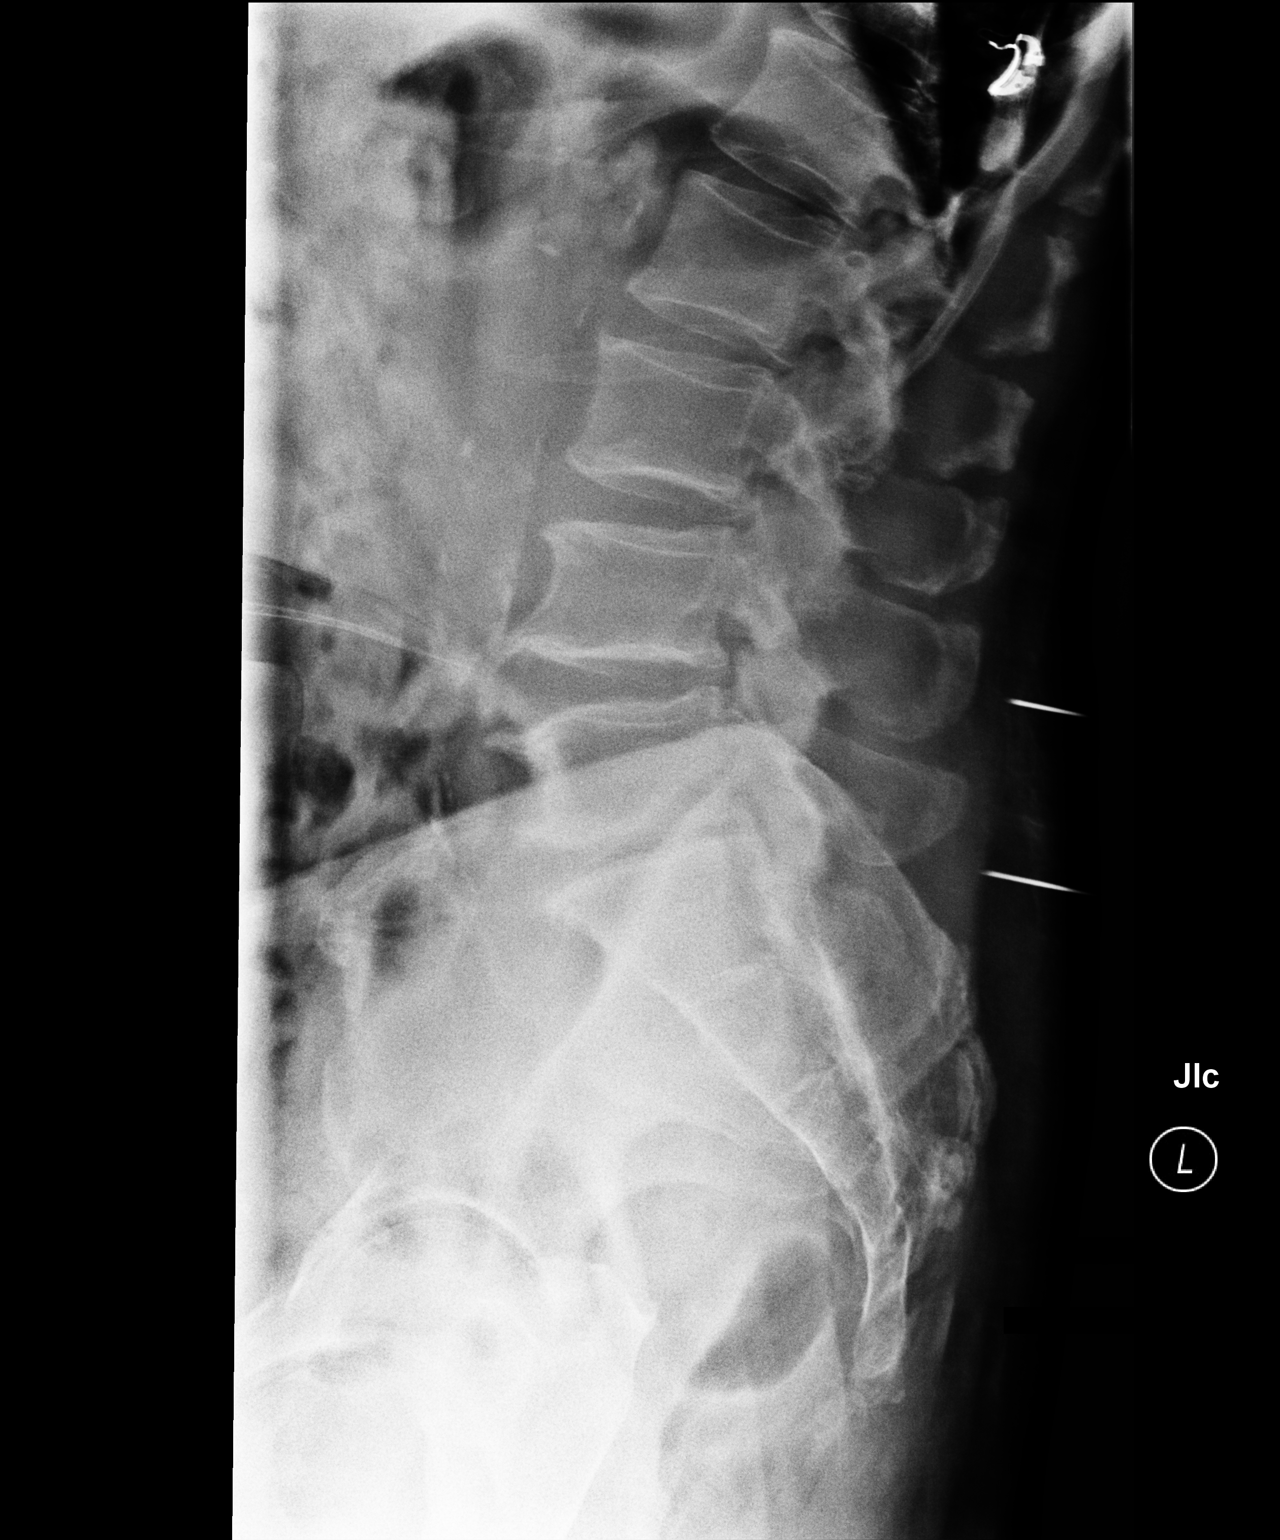

[1 of 1 positions shown; findings below may reference images not displayed]

FINDINGS: Portable cross-table lateral view of lumbar spine shows linear
metallic densities posterior to the spinous processes of L4 and L5
vertebrae. Degenerative changes are noted in the lumbar spine, more
severe at L4-L5 and L5-S1 levels.
IMPRESSION: Portable cross-table lateral view of lumbar spine was done for
intraoperative localization.

## 2023-04-02 DIAGNOSIS — Z9889 Other specified postprocedural states: Secondary | ICD-10-CM | POA: Diagnosis not present

## 2023-04-02 DIAGNOSIS — M5416 Radiculopathy, lumbar region: Secondary | ICD-10-CM | POA: Diagnosis not present

## 2023-04-02 DIAGNOSIS — G894 Chronic pain syndrome: Secondary | ICD-10-CM | POA: Diagnosis not present

## 2023-04-02 DIAGNOSIS — M47816 Spondylosis without myelopathy or radiculopathy, lumbar region: Secondary | ICD-10-CM | POA: Diagnosis not present

## 2023-04-02 DIAGNOSIS — Z79891 Long term (current) use of opiate analgesic: Secondary | ICD-10-CM | POA: Diagnosis not present

## 2023-04-02 DIAGNOSIS — M47812 Spondylosis without myelopathy or radiculopathy, cervical region: Secondary | ICD-10-CM | POA: Diagnosis not present

## 2023-04-02 DIAGNOSIS — M48061 Spinal stenosis, lumbar region without neurogenic claudication: Secondary | ICD-10-CM | POA: Diagnosis not present

## 2023-04-30 DIAGNOSIS — M48061 Spinal stenosis, lumbar region without neurogenic claudication: Secondary | ICD-10-CM | POA: Diagnosis not present

## 2023-04-30 DIAGNOSIS — Z79891 Long term (current) use of opiate analgesic: Secondary | ICD-10-CM | POA: Diagnosis not present

## 2023-04-30 DIAGNOSIS — M47812 Spondylosis without myelopathy or radiculopathy, cervical region: Secondary | ICD-10-CM | POA: Diagnosis not present

## 2023-04-30 DIAGNOSIS — M47816 Spondylosis without myelopathy or radiculopathy, lumbar region: Secondary | ICD-10-CM | POA: Diagnosis not present

## 2023-04-30 DIAGNOSIS — Z9889 Other specified postprocedural states: Secondary | ICD-10-CM | POA: Diagnosis not present

## 2023-04-30 DIAGNOSIS — M5416 Radiculopathy, lumbar region: Secondary | ICD-10-CM | POA: Diagnosis not present

## 2023-05-15 DIAGNOSIS — R051 Acute cough: Secondary | ICD-10-CM | POA: Diagnosis not present

## 2023-05-15 DIAGNOSIS — R0981 Nasal congestion: Secondary | ICD-10-CM | POA: Diagnosis not present

## 2023-06-04 DIAGNOSIS — Z79891 Long term (current) use of opiate analgesic: Secondary | ICD-10-CM | POA: Diagnosis not present

## 2023-06-04 DIAGNOSIS — M48061 Spinal stenosis, lumbar region without neurogenic claudication: Secondary | ICD-10-CM | POA: Diagnosis not present

## 2023-06-04 DIAGNOSIS — M47812 Spondylosis without myelopathy or radiculopathy, cervical region: Secondary | ICD-10-CM | POA: Diagnosis not present

## 2023-06-04 DIAGNOSIS — M47816 Spondylosis without myelopathy or radiculopathy, lumbar region: Secondary | ICD-10-CM | POA: Diagnosis not present

## 2023-06-04 DIAGNOSIS — M5416 Radiculopathy, lumbar region: Secondary | ICD-10-CM | POA: Diagnosis not present

## 2023-06-04 DIAGNOSIS — Z9889 Other specified postprocedural states: Secondary | ICD-10-CM | POA: Diagnosis not present

## 2023-06-04 DIAGNOSIS — G894 Chronic pain syndrome: Secondary | ICD-10-CM | POA: Diagnosis not present

## 2023-06-13 DIAGNOSIS — M654 Radial styloid tenosynovitis [de Quervain]: Secondary | ICD-10-CM | POA: Diagnosis not present

## 2023-07-11 DIAGNOSIS — M109 Gout, unspecified: Secondary | ICD-10-CM | POA: Diagnosis not present

## 2023-07-11 DIAGNOSIS — E1140 Type 2 diabetes mellitus with diabetic neuropathy, unspecified: Secondary | ICD-10-CM | POA: Diagnosis not present

## 2023-07-11 DIAGNOSIS — I10 Essential (primary) hypertension: Secondary | ICD-10-CM | POA: Diagnosis not present

## 2023-07-11 DIAGNOSIS — M47816 Spondylosis without myelopathy or radiculopathy, lumbar region: Secondary | ICD-10-CM | POA: Diagnosis not present

## 2023-07-11 DIAGNOSIS — E7800 Pure hypercholesterolemia, unspecified: Secondary | ICD-10-CM | POA: Diagnosis not present

## 2023-07-11 DIAGNOSIS — Z0000 Encounter for general adult medical examination without abnormal findings: Secondary | ICD-10-CM | POA: Diagnosis not present

## 2023-07-12 DIAGNOSIS — M109 Gout, unspecified: Secondary | ICD-10-CM | POA: Diagnosis not present

## 2023-07-12 DIAGNOSIS — M961 Postlaminectomy syndrome, not elsewhere classified: Secondary | ICD-10-CM | POA: Diagnosis not present

## 2023-07-12 DIAGNOSIS — M5416 Radiculopathy, lumbar region: Secondary | ICD-10-CM | POA: Diagnosis not present

## 2023-07-12 DIAGNOSIS — I10 Essential (primary) hypertension: Secondary | ICD-10-CM | POA: Diagnosis not present

## 2023-07-12 DIAGNOSIS — G894 Chronic pain syndrome: Secondary | ICD-10-CM | POA: Diagnosis not present

## 2023-07-12 DIAGNOSIS — M47816 Spondylosis without myelopathy or radiculopathy, lumbar region: Secondary | ICD-10-CM | POA: Diagnosis not present

## 2023-07-21 DIAGNOSIS — E7800 Pure hypercholesterolemia, unspecified: Secondary | ICD-10-CM | POA: Diagnosis not present

## 2023-07-21 DIAGNOSIS — M1990 Unspecified osteoarthritis, unspecified site: Secondary | ICD-10-CM | POA: Diagnosis not present

## 2023-07-21 DIAGNOSIS — E1169 Type 2 diabetes mellitus with other specified complication: Secondary | ICD-10-CM | POA: Diagnosis not present

## 2023-07-23 DIAGNOSIS — M654 Radial styloid tenosynovitis [de Quervain]: Secondary | ICD-10-CM | POA: Diagnosis not present

## 2023-08-14 DIAGNOSIS — E559 Vitamin D deficiency, unspecified: Secondary | ICD-10-CM | POA: Diagnosis not present

## 2023-08-14 DIAGNOSIS — M961 Postlaminectomy syndrome, not elsewhere classified: Secondary | ICD-10-CM | POA: Diagnosis not present

## 2023-08-14 DIAGNOSIS — E1140 Type 2 diabetes mellitus with diabetic neuropathy, unspecified: Secondary | ICD-10-CM | POA: Diagnosis not present

## 2023-08-14 DIAGNOSIS — M47816 Spondylosis without myelopathy or radiculopathy, lumbar region: Secondary | ICD-10-CM | POA: Diagnosis not present

## 2023-08-14 DIAGNOSIS — I10 Essential (primary) hypertension: Secondary | ICD-10-CM | POA: Diagnosis not present

## 2023-08-20 DIAGNOSIS — E7800 Pure hypercholesterolemia, unspecified: Secondary | ICD-10-CM | POA: Diagnosis not present

## 2023-08-20 DIAGNOSIS — E1169 Type 2 diabetes mellitus with other specified complication: Secondary | ICD-10-CM | POA: Diagnosis not present

## 2023-08-20 DIAGNOSIS — M1990 Unspecified osteoarthritis, unspecified site: Secondary | ICD-10-CM | POA: Diagnosis not present

## 2023-08-22 DIAGNOSIS — M654 Radial styloid tenosynovitis [de Quervain]: Secondary | ICD-10-CM | POA: Diagnosis not present

## 2023-09-14 DIAGNOSIS — E559 Vitamin D deficiency, unspecified: Secondary | ICD-10-CM | POA: Diagnosis not present

## 2023-09-14 DIAGNOSIS — I10 Essential (primary) hypertension: Secondary | ICD-10-CM | POA: Diagnosis not present

## 2023-09-14 DIAGNOSIS — E1140 Type 2 diabetes mellitus with diabetic neuropathy, unspecified: Secondary | ICD-10-CM | POA: Diagnosis not present

## 2023-09-14 DIAGNOSIS — M47816 Spondylosis without myelopathy or radiculopathy, lumbar region: Secondary | ICD-10-CM | POA: Diagnosis not present

## 2023-09-14 DIAGNOSIS — M961 Postlaminectomy syndrome, not elsewhere classified: Secondary | ICD-10-CM | POA: Diagnosis not present

## 2023-10-17 DIAGNOSIS — H401132 Primary open-angle glaucoma, bilateral, moderate stage: Secondary | ICD-10-CM | POA: Diagnosis not present

## 2023-10-21 DIAGNOSIS — E7800 Pure hypercholesterolemia, unspecified: Secondary | ICD-10-CM | POA: Diagnosis not present

## 2023-10-21 DIAGNOSIS — M1990 Unspecified osteoarthritis, unspecified site: Secondary | ICD-10-CM | POA: Diagnosis not present

## 2023-10-21 DIAGNOSIS — E1169 Type 2 diabetes mellitus with other specified complication: Secondary | ICD-10-CM | POA: Diagnosis not present

## 2023-10-24 DIAGNOSIS — E1140 Type 2 diabetes mellitus with diabetic neuropathy, unspecified: Secondary | ICD-10-CM | POA: Diagnosis not present

## 2023-10-24 DIAGNOSIS — I10 Essential (primary) hypertension: Secondary | ICD-10-CM | POA: Diagnosis not present

## 2023-10-24 DIAGNOSIS — M109 Gout, unspecified: Secondary | ICD-10-CM | POA: Diagnosis not present

## 2023-10-24 DIAGNOSIS — Z79891 Long term (current) use of opiate analgesic: Secondary | ICD-10-CM | POA: Diagnosis not present

## 2023-10-24 DIAGNOSIS — M47816 Spondylosis without myelopathy or radiculopathy, lumbar region: Secondary | ICD-10-CM | POA: Diagnosis not present

## 2023-10-24 DIAGNOSIS — M961 Postlaminectomy syndrome, not elsewhere classified: Secondary | ICD-10-CM | POA: Diagnosis not present

## 2023-10-24 DIAGNOSIS — E559 Vitamin D deficiency, unspecified: Secondary | ICD-10-CM | POA: Diagnosis not present

## 2023-11-08 DIAGNOSIS — E1140 Type 2 diabetes mellitus with diabetic neuropathy, unspecified: Secondary | ICD-10-CM | POA: Diagnosis not present

## 2023-11-20 DIAGNOSIS — M1990 Unspecified osteoarthritis, unspecified site: Secondary | ICD-10-CM | POA: Diagnosis not present

## 2023-11-20 DIAGNOSIS — E1169 Type 2 diabetes mellitus with other specified complication: Secondary | ICD-10-CM | POA: Diagnosis not present

## 2023-11-20 DIAGNOSIS — E7800 Pure hypercholesterolemia, unspecified: Secondary | ICD-10-CM | POA: Diagnosis not present
